# Patient Record
Sex: Female | Born: 1980 | Race: White | Hispanic: No | Marital: Married | Smoking: Never smoker
Health system: Southern US, Community
[De-identification: ages and names within clinical notes are randomized; demographics above are authoritative.]

## PROBLEM LIST (undated history)

## (undated) DIAGNOSIS — T7840XA Allergy, unspecified, initial encounter: Secondary | ICD-10-CM

## (undated) DIAGNOSIS — F419 Anxiety disorder, unspecified: Secondary | ICD-10-CM

## (undated) HISTORY — PX: MOLE REMOVAL: SHX2046

## (undated) HISTORY — DX: Anxiety disorder, unspecified: F41.9

## (undated) HISTORY — DX: Allergy, unspecified, initial encounter: T78.40XA

---

## 2005-03-16 LAB — HM MAMMOGRAPHY: HM Mammogram: NEGATIVE

## 2010-02-19 ENCOUNTER — Encounter: Payer: Self-pay | Admitting: Family Medicine

## 2010-02-19 ENCOUNTER — Ambulatory Visit: Payer: Self-pay | Admitting: Obstetrics & Gynecology

## 2010-02-19 LAB — CONVERTED CEMR LAB
Antibody Screen: NEGATIVE
Eosinophils Relative: 2 % (ref 0–5)
HCT: 38.5 % (ref 36.0–46.0)
Hepatitis B Surface Ag: NEGATIVE
Lymphocytes Relative: 26 % (ref 12–46)
Lymphs Abs: 2.4 10*3/uL (ref 0.7–4.0)
MCHC: 33.5 g/dL (ref 30.0–36.0)
MCV: 84.2 fL (ref 78.0–100.0)
Neutrophils Relative %: 65 % (ref 43–77)
Platelets: 265 10*3/uL (ref 150–400)
RDW: 13.2 % (ref 11.5–15.5)
Rh Type: POSITIVE
Rubella: 69.8 intl units/mL — ABNORMAL HIGH
WBC: 9.2 10*3/uL (ref 4.0–10.5)

## 2010-03-04 ENCOUNTER — Ambulatory Visit (HOSPITAL_COMMUNITY): Admission: RE | Admit: 2010-03-04 | Discharge: 2010-03-04 | Payer: Self-pay | Admitting: Obstetrics and Gynecology

## 2010-03-04 ENCOUNTER — Ambulatory Visit: Payer: Self-pay | Admitting: Family Medicine

## 2010-03-04 IMAGING — US US OB COMP LESS 14 WK
1 series · 14 of 28 positions shown · non-contrast
Comparison: none

OBSTETRICAL ULTRASOUND:
 This ultrasound exam was performed in the [HOSPITAL] Ultrasound Department.  The OB US report was generated in the AS system, and faxed to the ordering physician.  This report is also available in [HOSPITAL]?s Access[employer]ware and in [REDACTED] PACS.

[Series 1: us ob comp less 14 wks · 0.21mm/px · 14 of 74 slices shown]
[im 3/74]
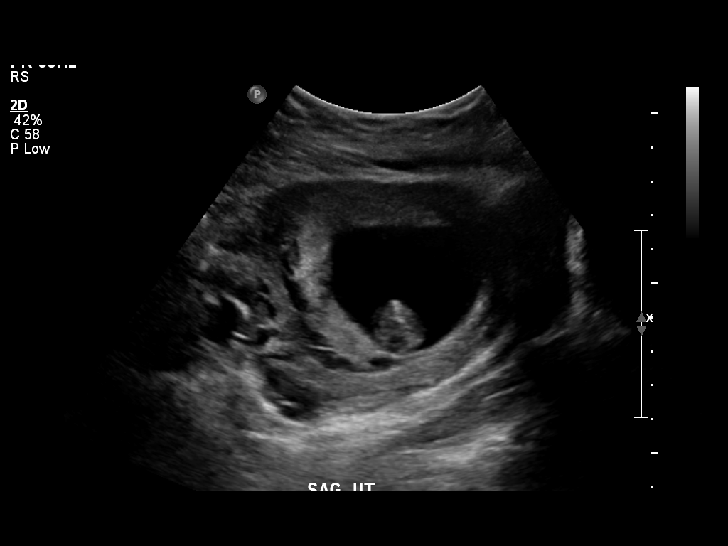
[im 9/74]
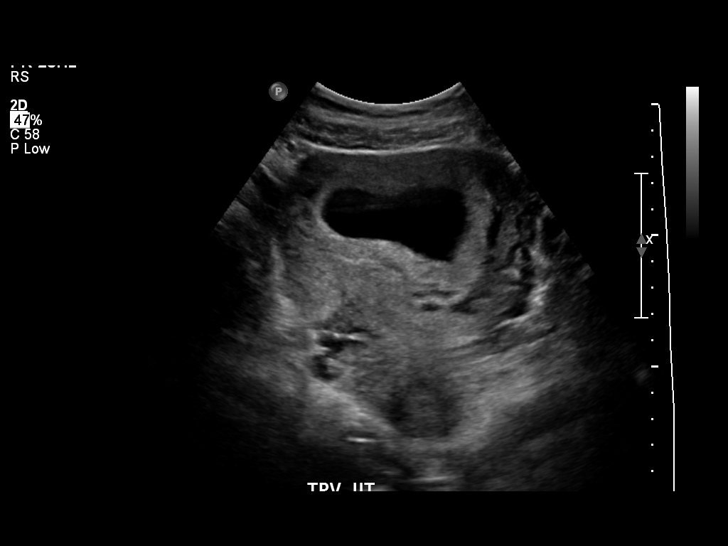
[im 14/74]
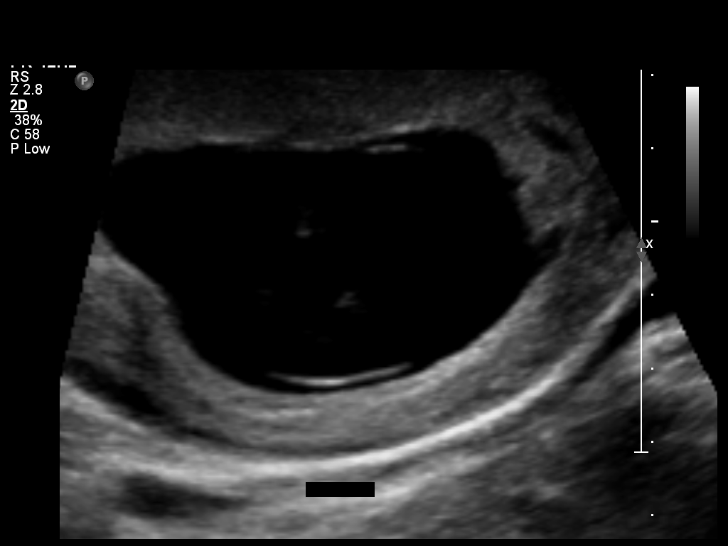
[im 19/74]
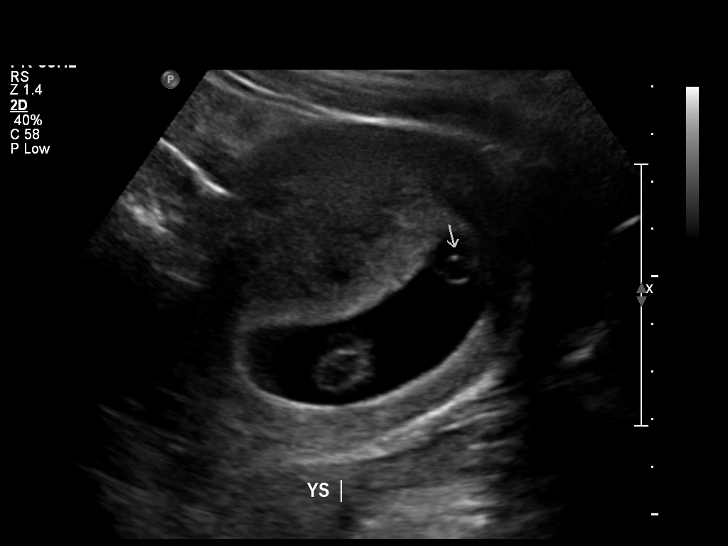
[im 25/74]
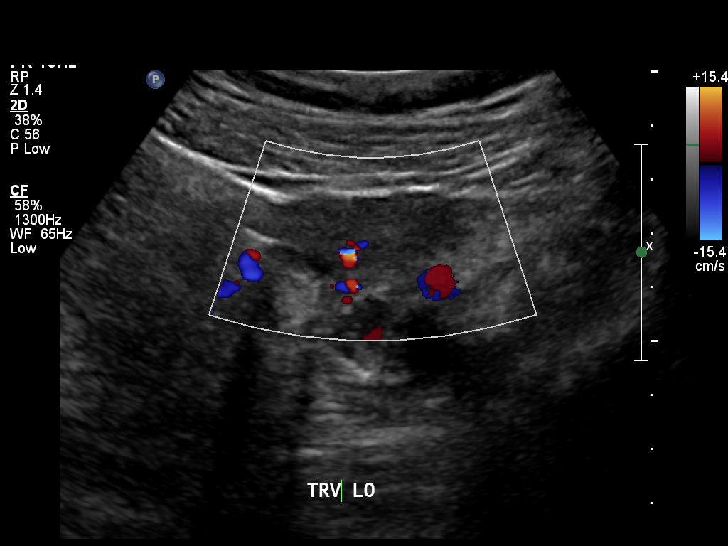
[im 30/74]
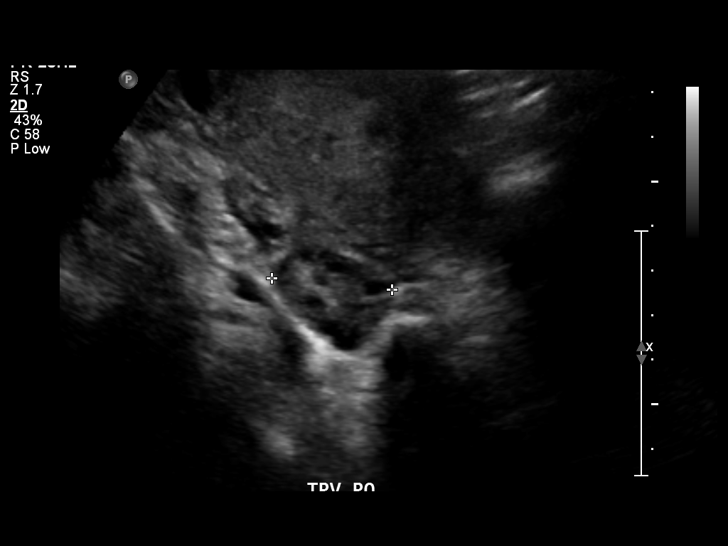
[im 36/74]
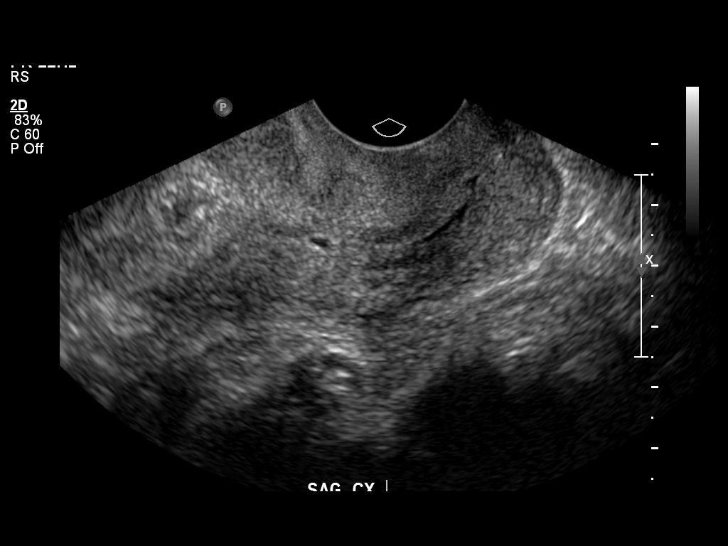
[im 41/74]
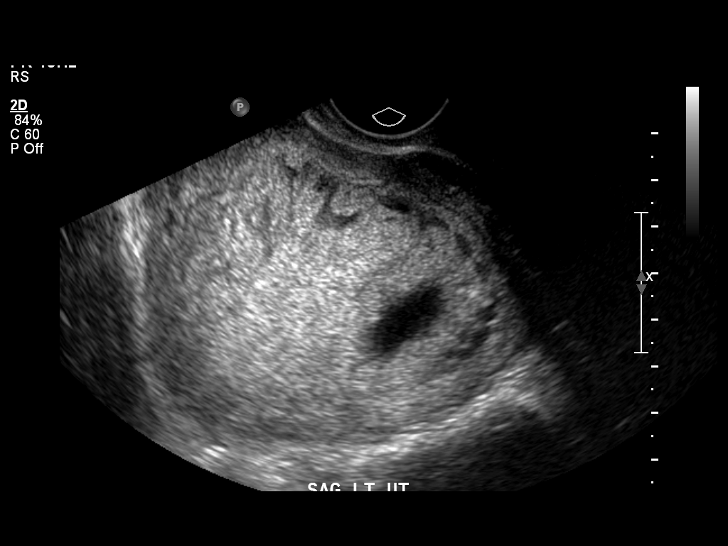
[im 46/74]
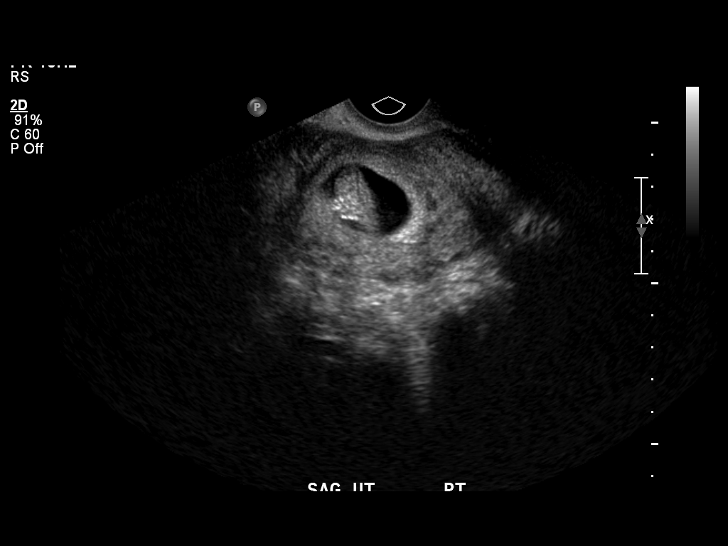
[im 52/74]
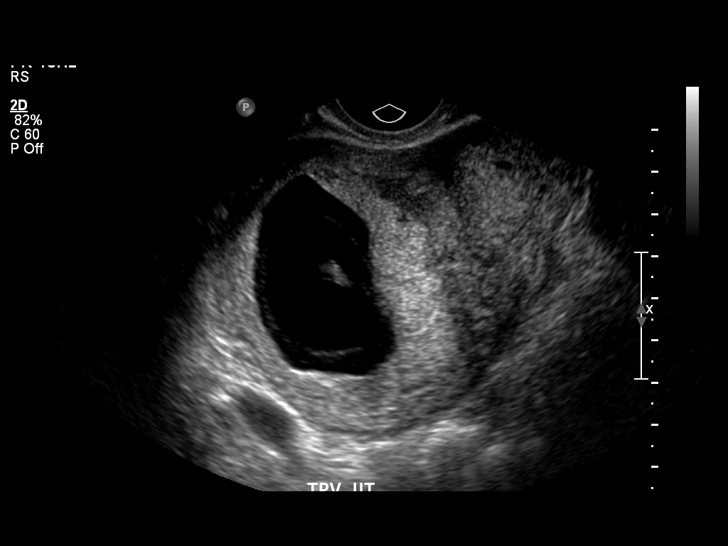
[im 57/74]
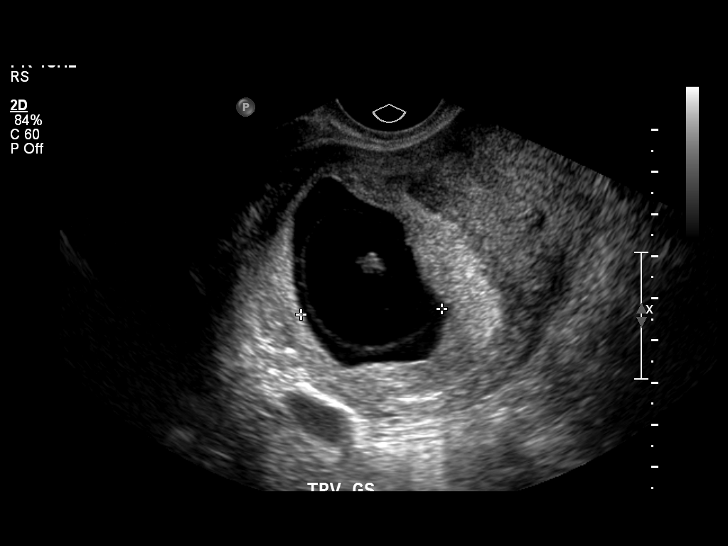
[im 63/74]
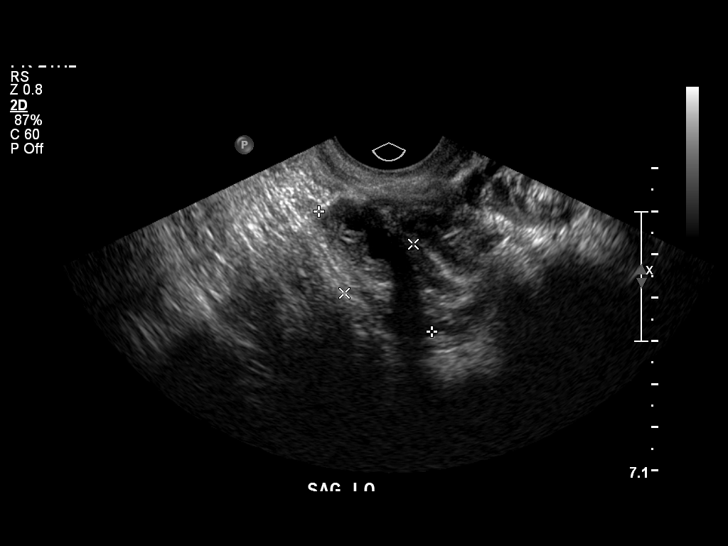
[im 68/74]
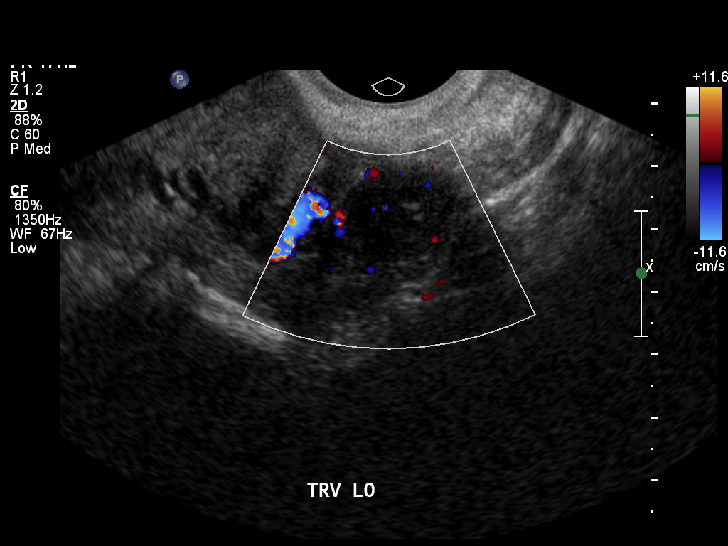
[im 74/74]
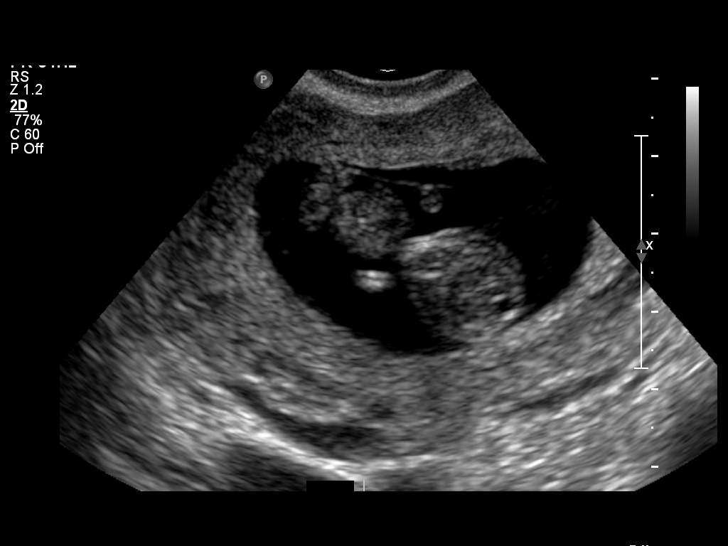

[14 of 28 positions shown; findings below may reference images not displayed]

IMPRESSION: See AS Obstetric US report.

## 2010-03-14 ENCOUNTER — Ambulatory Visit: Payer: Self-pay | Admitting: Obstetrics & Gynecology

## 2010-03-15 ENCOUNTER — Inpatient Hospital Stay (HOSPITAL_COMMUNITY): Admission: AD | Admit: 2010-03-15 | Discharge: 2010-03-15 | Payer: Self-pay | Admitting: Obstetrics & Gynecology

## 2010-04-01 ENCOUNTER — Ambulatory Visit: Payer: Self-pay | Admitting: Obstetrics & Gynecology

## 2010-09-19 ENCOUNTER — Observation Stay: Payer: Self-pay

## 2010-09-21 ENCOUNTER — Inpatient Hospital Stay: Payer: Self-pay

## 2010-10-14 ENCOUNTER — Ambulatory Visit: Payer: Self-pay | Admitting: Internal Medicine

## 2010-10-23 ENCOUNTER — Ambulatory Visit: Payer: Self-pay | Admitting: Internal Medicine

## 2010-11-13 ENCOUNTER — Ambulatory Visit: Payer: Self-pay | Admitting: Internal Medicine

## 2011-01-04 ENCOUNTER — Encounter: Payer: Self-pay | Admitting: Obstetrics & Gynecology

## 2011-08-28 ENCOUNTER — Other Ambulatory Visit: Payer: Self-pay | Admitting: Dermatology

## 2013-03-21 ENCOUNTER — Ambulatory Visit: Payer: Self-pay | Admitting: Internal Medicine

## 2013-04-17 LAB — HM PAP SMEAR: HM PAP: NORMAL

## 2014-08-04 ENCOUNTER — Inpatient Hospital Stay: Payer: Self-pay | Admitting: Obstetrics & Gynecology

## 2014-08-04 LAB — CBC WITH DIFFERENTIAL/PLATELET
BASOS PCT: 0.4 %
Basophil #: 0 10*3/uL (ref 0.0–0.1)
EOS ABS: 0.1 10*3/uL (ref 0.0–0.7)
Eosinophil %: 0.8 %
HCT: 33.8 % — ABNORMAL LOW (ref 35.0–47.0)
HGB: 11 g/dL — AB (ref 12.0–16.0)
LYMPHS ABS: 1.1 10*3/uL (ref 1.0–3.6)
LYMPHS PCT: 13.5 %
MCH: 28.1 pg (ref 26.0–34.0)
MCHC: 32.4 g/dL (ref 32.0–36.0)
MCV: 87 fL (ref 80–100)
MONOS PCT: 5.5 %
Monocyte #: 0.5 x10 3/mm (ref 0.2–0.9)
NEUTROS ABS: 6.7 10*3/uL — AB (ref 1.4–6.5)
Neutrophil %: 79.8 %
PLATELETS: 200 10*3/uL (ref 150–440)
RBC: 3.9 10*6/uL (ref 3.80–5.20)
RDW: 13.9 % (ref 11.5–14.5)
WBC: 8.4 10*3/uL (ref 3.6–11.0)

## 2015-08-28 ENCOUNTER — Ambulatory Visit (INDEPENDENT_AMBULATORY_CARE_PROVIDER_SITE_OTHER): Payer: BLUE CROSS/BLUE SHIELD | Admitting: Nurse Practitioner

## 2015-08-28 ENCOUNTER — Encounter: Payer: Self-pay | Admitting: Nurse Practitioner

## 2015-08-28 ENCOUNTER — Encounter (INDEPENDENT_AMBULATORY_CARE_PROVIDER_SITE_OTHER): Payer: Self-pay

## 2015-08-28 VITALS — BP 104/84 | HR 91 | Temp 98.2°F | Resp 14 | Ht 65.0 in | Wt 192.6 lb

## 2015-08-28 DIAGNOSIS — Z23 Encounter for immunization: Secondary | ICD-10-CM | POA: Diagnosis not present

## 2015-08-28 DIAGNOSIS — Z7189 Other specified counseling: Secondary | ICD-10-CM | POA: Diagnosis not present

## 2015-08-28 DIAGNOSIS — Z7689 Persons encountering health services in other specified circumstances: Secondary | ICD-10-CM | POA: Insufficient documentation

## 2015-08-28 DIAGNOSIS — Z889 Allergy status to unspecified drugs, medicaments and biological substances status: Secondary | ICD-10-CM | POA: Insufficient documentation

## 2015-08-28 DIAGNOSIS — Z9109 Other allergy status, other than to drugs and biological substances: Secondary | ICD-10-CM | POA: Diagnosis not present

## 2015-08-28 DIAGNOSIS — D229 Melanocytic nevi, unspecified: Secondary | ICD-10-CM | POA: Insufficient documentation

## 2015-08-28 DIAGNOSIS — D239 Other benign neoplasm of skin, unspecified: Secondary | ICD-10-CM

## 2015-08-28 DIAGNOSIS — Z01419 Encounter for gynecological examination (general) (routine) without abnormal findings: Secondary | ICD-10-CM | POA: Insufficient documentation

## 2015-08-28 DIAGNOSIS — F411 Generalized anxiety disorder: Secondary | ICD-10-CM | POA: Diagnosis not present

## 2015-08-28 DIAGNOSIS — Z0000 Encounter for general adult medical examination without abnormal findings: Secondary | ICD-10-CM

## 2015-08-28 LAB — LIPID PANEL
CHOLESTEROL: 215 mg/dL — AB (ref 0–200)
HDL: 74.3 mg/dL (ref >39.00)
LDL Cholesterol: 116 mg/dL — ABNORMAL HIGH (ref 0–99)
NONHDL: 140.64
Total CHOL/HDL Ratio: 3
Triglycerides: 123 mg/dL (ref 0.0–149.0)
VLDL: 24.6 mg/dL (ref 0.0–40.0)

## 2015-08-28 LAB — CBC WITH DIFFERENTIAL/PLATELET
BASOS PCT: 0.6 % (ref 0.0–3.0)
Basophils Absolute: 0 10*3/uL (ref 0.0–0.1)
EOS PCT: 1.1 % (ref 0.0–5.0)
Eosinophils Absolute: 0.1 10*3/uL (ref 0.0–0.7)
HCT: 40.6 % (ref 36.0–46.0)
HEMOGLOBIN: 13.7 g/dL (ref 12.0–15.0)
Lymphocytes Relative: 25 % (ref 12.0–46.0)
Lymphs Abs: 1.4 10*3/uL (ref 0.7–4.0)
MCHC: 33.7 g/dL (ref 30.0–36.0)
MCV: 86.3 fl (ref 78.0–100.0)
MONOS PCT: 6.1 % (ref 3.0–12.0)
Monocytes Absolute: 0.3 10*3/uL (ref 0.1–1.0)
Neutro Abs: 3.7 10*3/uL (ref 1.4–7.7)
Neutrophils Relative %: 67.2 % (ref 43.0–77.0)
Platelets: 259 10*3/uL (ref 150.0–400.0)
RBC: 4.71 Mil/uL (ref 3.87–5.11)
RDW: 13.1 % (ref 11.5–15.5)
WBC: 5.5 10*3/uL (ref 4.0–10.5)

## 2015-08-28 LAB — COMPREHENSIVE METABOLIC PANEL
ALBUMIN: 4.4 g/dL (ref 3.5–5.2)
ALK PHOS: 80 U/L (ref 39–117)
ALT: 15 U/L (ref 0–35)
AST: 14 U/L (ref 0–37)
BILIRUBIN TOTAL: 0.8 mg/dL (ref 0.2–1.2)
BUN: 16 mg/dL (ref 6–23)
CO2: 23 mEq/L (ref 19–32)
Calcium: 8.8 mg/dL (ref 8.4–10.5)
Chloride: 104 mEq/L (ref 96–112)
Creatinine, Ser: 0.73 mg/dL (ref 0.40–1.20)
GFR: 95.91 mL/min (ref >60.00)
GLUCOSE: 102 mg/dL — AB (ref 70–99)
POTASSIUM: 3.5 meq/L (ref 3.5–5.1)
Sodium: 136 mEq/L (ref 135–145)
TOTAL PROTEIN: 7.6 g/dL (ref 6.0–8.3)

## 2015-08-28 NOTE — Progress Notes (Signed)
Pre visit review using our clinic review tool, if applicable. No additional management support is needed unless otherwise documented below in the visit note. 

## 2015-08-28 NOTE — Assessment & Plan Note (Signed)
Physical today due to no complaints. Will obtain records from previous provider at [facility].

## 2015-08-28 NOTE — Assessment & Plan Note (Signed)
Stable on Lexapro 10 mg daily. No need for refills. Will follow

## 2015-08-28 NOTE — Patient Instructions (Signed)

## 2015-08-28 NOTE — Assessment & Plan Note (Signed)
Stable. Pt sees Dermatology in [place] yearly for full body check. She has had multiple sites biopsied.

## 2015-08-28 NOTE — Progress Notes (Signed)
Patient ID: Sophia Garrett, female    DOB: 02/02/1981  Age: 34 y.o. MRN: [id number]  CC: Establish Care   HPI Sophia Garrett presents for establsihing care and annual exam.   1) Health Maintenance-   Diet- No formal   Exercise- Tennis 3-4 days a week 1 hour   Immunizations- unknown tdap; Flu  Mammogram- 2006, normal   Pap- 2014  Eye Exam- 2014, no vision changes   LMP- 4 weeks ago normal for pt, still nursing   2) Chronic Problems-  Depression/Anxiety- Lexapro 10 mg, switched to generic 2 weeks ago and notices a small difference   Allergies- Dr. Remus Blake, allergy testing    Zyrtec daily   Has dermatologist in [place], multiple nevi removed.  History Sophia Garrett has a past medical history of Anxiety and Allergy.   She has no past surgical history on file.   Her family history includes Alcohol abuse in her father; Cancer in her maternal aunt, maternal grandfather, maternal grandmother, paternal aunt, paternal aunt, paternal grandfather, and paternal grandmother; Heart disease in her father; Hyperlipidemia in her mother; Hypertension in her father.She reports that she has never smoked. She has never used smokeless tobacco. She reports that she drinks alcohol. She reports that she does not use illicit drugs.  No outpatient prescriptions prior to visit.   No facility-administered medications prior to visit.    ROS Review of Systems  Constitutional: Negative for fever, chills, diaphoresis and fatigue.  Respiratory: Negative for chest tightness, shortness of breath and wheezing.   Cardiovascular: Negative for chest pain, palpitations and leg swelling.  Gastrointestinal: Negative for nausea, vomiting and diarrhea.  Skin: Negative for rash.  Neurological: Negative for dizziness, weakness, numbness and headaches.  Psychiatric/Behavioral: The patient is not nervous/anxious.     Objective:  BP 104/84 mmHg  Pulse 91  Temp(Src) 98.2 F (36.8 C)  Resp 14  Ht 5\' 5"  (1.651 m)  Wt 192  lb 9.6 oz (87.363 kg)  BMI 32.05 kg/m2  SpO2 96%  Physical Exam  Constitutional: She is oriented to person, place, and time. She appears well-developed and well-nourished. No distress.  HENT:  Head: Normocephalic and atraumatic.  Right Ear: External ear normal.  Left Ear: External ear normal.  Mouth/Throat: No oropharyngeal exudate.  TM's clear bilaterally  Eyes: EOM are normal. Pupils are equal, round, and reactive to light. Right eye exhibits no discharge. Left eye exhibits no discharge. No scleral icterus.  Neck: Normal range of motion. Neck supple. No thyromegaly present.  Cardiovascular: Normal rate, regular rhythm and normal heart sounds.  Exam reveals no gallop and no friction rub.   No murmur heard. Pulmonary/Chest: Effort normal and breath sounds normal. No respiratory distress. She has no wheezes. She has no rales. She exhibits no tenderness.  Abdominal: Soft. Bowel sounds are normal. She exhibits no distension and no mass. There is no tenderness. There is no rebound and no guarding.  Obese  Genitourinary:  Deferred due to no symptoms and due next yr  Musculoskeletal: Normal range of motion. She exhibits no edema or tenderness.  Crepitus of knees bilaterally  Lymphadenopathy:    She has no cervical adenopathy.  Neurological: She is alert and oriented to person, place, and time. No cranial nerve deficit. She exhibits normal muscle tone. Coordination normal.  Skin: Skin is warm and dry. No rash noted. She is not diaphoretic.  Psychiatric: She has a normal mood and affect. Her behavior is normal. Judgment and thought content normal.   Assessment &  Plan:   Sophia Garrett was seen today for establish care.  Diagnoses and all orders for this visit:  Routine general medical examination at a health care facility -     Comprehensive metabolic panel -     Lipid panel -     CBC with Differential/Platelet  Encounter to establish care  Generalized anxiety disorder  Multiple  allergies  Multiple benign nevi   I am having Sophia Garrett maintain her escitalopram.  Meds ordered this encounter  Medications  . escitalopram (LEXAPRO) 10 MG tablet    Sig: Take 10 mg by mouth daily.     Follow-up: Return in about 1 year (around 08/27/2016) for CPE.

## 2015-08-28 NOTE — Assessment & Plan Note (Signed)
Discussed acute and chronic issues. Reviewed health maintenance measures, PFSHx, and immunizations. Obtain routine labs Lipid panel, CBC w/ diff, and CMET.   Flu vaccination given today  PAP due 2017 and Clinical breast exam

## 2015-08-28 NOTE — Assessment & Plan Note (Signed)
Pt saw allergist in past with testing. She reports multiple environmental allergies and consistent use of Zyrtec. Will follow

## 2015-09-26 ENCOUNTER — Encounter: Payer: Self-pay | Admitting: Nurse Practitioner

## 2015-09-26 ENCOUNTER — Ambulatory Visit (INDEPENDENT_AMBULATORY_CARE_PROVIDER_SITE_OTHER): Payer: BLUE CROSS/BLUE SHIELD | Admitting: Nurse Practitioner

## 2015-09-26 VITALS — BP 112/70 | HR 89 | Temp 98.5°F | Resp 14 | Ht 65.0 in | Wt 194.8 lb

## 2015-09-26 DIAGNOSIS — M791 Myalgia: Secondary | ICD-10-CM | POA: Diagnosis not present

## 2015-09-26 DIAGNOSIS — M609 Myositis, unspecified: Secondary | ICD-10-CM | POA: Diagnosis not present

## 2015-09-26 DIAGNOSIS — IMO0001 Reserved for inherently not codable concepts without codable children: Secondary | ICD-10-CM | POA: Insufficient documentation

## 2015-09-26 LAB — CBC WITH DIFFERENTIAL/PLATELET
BASOS ABS: 0 10*3/uL (ref 0.0–0.1)
Basophils Relative: 0.5 % (ref 0.0–3.0)
EOS PCT: 2.3 % (ref 0.0–5.0)
Eosinophils Absolute: 0.2 10*3/uL (ref 0.0–0.7)
HEMATOCRIT: 37.6 % (ref 36.0–46.0)
HEMOGLOBIN: 12.7 g/dL (ref 12.0–15.0)
LYMPHS ABS: 1.5 10*3/uL (ref 0.7–4.0)
LYMPHS PCT: 19.5 % (ref 12.0–46.0)
MCHC: 33.7 g/dL (ref 30.0–36.0)
MCV: 85.7 fl (ref 78.0–100.0)
MONOS PCT: 6.1 % (ref 3.0–12.0)
Monocytes Absolute: 0.5 10*3/uL (ref 0.1–1.0)
NEUTROS PCT: 71.6 % (ref 43.0–77.0)
Neutro Abs: 5.5 10*3/uL (ref 1.4–7.7)
Platelets: 283 10*3/uL (ref 150.0–400.0)
RBC: 4.39 Mil/uL (ref 3.87–5.11)
RDW: 12.8 % (ref 11.5–15.5)
WBC: 7.7 10*3/uL (ref 4.0–10.5)

## 2015-09-26 LAB — COMPREHENSIVE METABOLIC PANEL
ALBUMIN: 4.1 g/dL (ref 3.5–5.2)
ALK PHOS: 81 U/L (ref 39–117)
ALT: 20 U/L (ref 0–35)
AST: 22 U/L (ref 0–37)
BUN: 13 mg/dL (ref 6–23)
CHLORIDE: 103 meq/L (ref 96–112)
CO2: 26 mEq/L (ref 19–32)
Calcium: 9.2 mg/dL (ref 8.4–10.5)
Creatinine, Ser: 0.76 mg/dL (ref 0.40–1.20)
GFR: 91.51 mL/min (ref >60.00)
Glucose, Bld: 93 mg/dL (ref 70–99)
POTASSIUM: 3.7 meq/L (ref 3.5–5.1)
SODIUM: 137 meq/L (ref 135–145)
Total Bilirubin: 0.4 mg/dL (ref 0.2–1.2)
Total Protein: 7.3 g/dL (ref 6.0–8.3)

## 2015-09-26 LAB — CK: CK TOTAL: 109 U/L (ref 7–177)

## 2015-09-26 NOTE — Patient Instructions (Signed)
LOTS OF WATER! Take it easy.

## 2015-09-26 NOTE — Assessment & Plan Note (Signed)
Patient's husband was diagnosed with viral cause for rhabdomyolysis. He would like to have testing done today. She had similar symptoms but on a milder scale. Asked her to continue drinking lots of water and to follow-up if anything changes.

## 2015-09-26 NOTE — Progress Notes (Signed)
Patient ID: Sophia Garrett, female    DOB: March 01, 1981  Age: 34 y.o. MRN: [id number]  CC: Muscle Pain   HPI Sophia Garrett presents for myalgias x 1 week.   1) Last Thursday 99.5  Husband had sore muscles, 101 fever, and was much more sore.  Pt reports her back was sore and hands were sore  Urine not dark she reports  Husband diagnosed with viral cause of rhabdomyolysis   History Sophia Garrett has a past medical history of Anxiety and Allergy.   She has no past surgical history on file.   Her family history includes Alcohol abuse in her father; Cancer in her maternal aunt, maternal grandfather, maternal grandmother, paternal aunt, paternal aunt, paternal grandfather, and paternal grandmother; Heart disease in her father; Hyperlipidemia in her mother; Hypertension in her father.She reports that she has never smoked. She has never used smokeless tobacco. She reports that she drinks alcohol. She reports that she does not use illicit drugs.  Outpatient Prescriptions Prior to Visit  Medication Sig Dispense Refill  . escitalopram (LEXAPRO) 10 MG tablet Take 10 mg by mouth daily.     No facility-administered medications prior to visit.    ROS Review of Systems  Constitutional: Negative for fever, chills, diaphoresis and fatigue.  Respiratory: Negative for chest tightness, shortness of breath and wheezing.   Cardiovascular: Negative for chest pain, palpitations and leg swelling.  Gastrointestinal: Negative for nausea, vomiting and diarrhea.  Genitourinary: Negative for dysuria.  Musculoskeletal: Positive for myalgias.  Skin: Negative for rash.  Neurological: Negative for dizziness, weakness, numbness and headaches.    Objective:  BP 112/70 mmHg  Pulse 89  Temp(Src) 98.5 F (36.9 C)  Resp 14  Ht 5\' 5"  (1.651 m)  Wt 194 lb 12.8 oz (88.361 kg)  BMI 32.42 kg/m2  SpO2 96%  Physical Exam  Constitutional: She is oriented to person, place, and time. She appears well-developed and  well-nourished. No distress.  HENT:  Head: Normocephalic and atraumatic.  Right Ear: External ear normal.  Left Ear: External ear normal.  Cardiovascular: Normal rate, regular rhythm and normal heart sounds.  Exam reveals no gallop and no friction rub.   No murmur heard. Pulmonary/Chest: Effort normal and breath sounds normal. No respiratory distress. She has no wheezes. She has no rales. She exhibits no tenderness.  Musculoskeletal: She exhibits tenderness. She exhibits no edema.  Generalized tenderness  Neurological: She is alert and oriented to person, place, and time. No cranial nerve deficit. She exhibits normal muscle tone. Coordination normal.  Skin: Skin is warm and dry. No rash noted. She is not diaphoretic.  Psychiatric: She has a normal mood and affect. Her behavior is normal. Judgment and thought content normal.   Assessment & Plan:   Sophia Garrett was seen today for muscle pain.  Diagnoses and all orders for this visit:  Myalgia and myositis -     Comprehensive metabolic panel -     CK (Creatine Kinase) -     CBC w/Diff   I am having Sophia Garrett maintain her escitalopram.  No orders of the defined types were placed in this encounter.     Follow-up: Return if symptoms worsen or fail to improve.

## 2015-09-26 NOTE — Progress Notes (Signed)
Pre visit review using our clinic review tool, if applicable. No additional management support is needed unless otherwise documented below in the visit note. 

## 2015-11-15 ENCOUNTER — Other Ambulatory Visit: Payer: Self-pay | Admitting: Internal Medicine

## 2015-11-15 MED ORDER — ESCITALOPRAM OXALATE 10 MG PO TABS: 10.0000 mg | ORAL_TABLET | Freq: Every day | ORAL | Status: DC

## 2016-07-06 ENCOUNTER — Other Ambulatory Visit: Payer: Self-pay | Admitting: Internal Medicine

## 2016-07-06 MED ORDER — ESCITALOPRAM OXALATE 10 MG PO TABS: 10.0000 mg | ORAL_TABLET | Freq: Every day | ORAL | 3 refills | Status: DC

## 2017-07-06 ENCOUNTER — Encounter: Payer: Self-pay | Admitting: Family

## 2017-07-06 ENCOUNTER — Encounter (INDEPENDENT_AMBULATORY_CARE_PROVIDER_SITE_OTHER): Payer: Self-pay

## 2017-07-06 ENCOUNTER — Ambulatory Visit (INDEPENDENT_AMBULATORY_CARE_PROVIDER_SITE_OTHER): Payer: No Typology Code available for payment source | Admitting: Family

## 2017-07-06 VITALS — BP 118/68 | HR 100 | Temp 98.4°F | Ht 65.0 in | Wt 196.2 lb

## 2017-07-06 DIAGNOSIS — Z111 Encounter for screening for respiratory tuberculosis: Secondary | ICD-10-CM

## 2017-07-06 DIAGNOSIS — Z7689 Persons encountering health services in other specified circumstances: Secondary | ICD-10-CM

## 2017-07-06 DIAGNOSIS — F411 Generalized anxiety disorder: Secondary | ICD-10-CM | POA: Diagnosis not present

## 2017-07-06 NOTE — Patient Instructions (Addendum)
Fasting labs when able to - make an appointment   Return for PPD read  May increase lexapro to 20mg  total per day if needed  Follow up 3-6 months

## 2017-07-06 NOTE — Progress Notes (Signed)
Subjective:    Patient ID: Pervis Hocking, female    DOB: 02-13-1981, 36 y.o.   MRN: [id number]  CC: Byron B Kantner is a 36 y.o. female who presents today for follow up.   HPI: Feeling well. No complaints today.   Today for health examination typically. She is a Corporate investment banker.  Depression and anxiety- has been on lexapro for several years, consistent for the past 3 years. Notes that has had anxiety attacks of late. Trouble sleeping as worrying about going back to work.  No thoughts of hurting herself of anyone else.  Denies exertional chest pain or pressure, numbness or tingling radiating to left arm or jaw, palpitations, dizziness, frequent headaches, changes in vision, or shortness of breath.   Follows with [facility], OB. Plans to discuss family h/o cancer and plans to pursue genetic testing.  Follows with dermatology.        HISTORY:  Past Medical History:  Diagnosis Date  . Allergy   . Anxiety    No past surgical history on file. Family History  Problem Relation Age of Onset  . Hyperlipidemia Mother   . Skin cancer Mother        'not melanoma'  . Alcohol abuse Father   . Heart disease Father   . Hypertension Father   . Cancer Maternal Aunt        Breast Cancer  . Cancer Paternal Aunt        Breast Cancer  . Cancer Maternal Grandmother        Breast Cancer  . Cancer Maternal Grandfather        Prostate/Lung Cancer  . Cancer Paternal Grandmother        Breast Cancer  . Cancer Paternal Grandfather        Prostate Cancer  . Cancer Paternal Aunt        Breast Cancer  . Colon cancer Neg Hx     Allergies: Patient has no known allergies. Current Outpatient Prescriptions on File Prior to Visit  Medication Sig Dispense Refill  . escitalopram (LEXAPRO) 10 MG tablet Take 1 tablet (10 mg total) by mouth daily. 90 tablet 3   No current facility-administered medications on file prior to visit.     Social History  Substance Use Topics  . Smoking  status: Never Smoker  . Smokeless tobacco: Never Used  . Alcohol use 0.0 oz/week     Comment: Occasionaly     Review of Systems  Constitutional: Negative for chills and fever.  HENT: Negative for hearing loss.   Eyes: Negative for visual disturbance.  Respiratory: Negative for cough and shortness of breath.   Cardiovascular: Negative for chest pain and palpitations.  Gastrointestinal: Negative for nausea and vomiting.  Neurological: Negative for headaches.  Psychiatric/Behavioral: Positive for sleep disturbance. The patient is nervous/anxious.       Objective:    BP 118/68   Pulse 100   Temp 98.4 F (36.9 C) (Oral)   Ht 5\' 5"  (1.651 m)   Wt 196 lb 3.2 oz (89 kg)   SpO2 97%   BMI 32.65 kg/m  BP Readings from Last 3 Encounters:  07/06/17 118/68  09/26/15 112/70  08/28/15 104/84   Wt Readings from Last 3 Encounters:  07/06/17 196 lb 3.2 oz (89 kg)  09/26/15 194 lb 12.8 oz (88.4 kg)  08/28/15 192 lb 9.6 oz (87.4 kg)    Physical Exam  Constitutional: She appears well-developed and well-nourished.  Eyes: Conjunctivae are normal.  Cardiovascular: Normal rate, regular rhythm, normal heart sounds and normal pulses.   Pulmonary/Chest: Effort normal and breath sounds normal. She has no wheezes. She has no rhonchi. She has no rales.  Neurological: She is alert.  Skin: Skin is warm and dry.  Psychiatric: She has a normal mood and affect. Her speech is normal and behavior is normal. Thought content normal.  Vitals reviewed.      Assessment & Plan:   Problem List Items Addressed This Visit      Other   Encounter to establish care    Advised to discuss family breast cancer history with OB, including genetic screening and earlier  mammogram. Declines CBE and pelvic as follows with OB. PPD placed. Will return for recheck. Form for health examination will be completed after reading. Immunization UTD. Screening labs ordered.       Relevant Orders   CBC with  Differential/Platelet   Comprehensive metabolic panel   Hemoglobin A1c   Lipid panel   TSH   VITAMIN D 25 Hydroxy (Vit-D Deficiency, Fractures)   Generalized anxiety disorder - Primary    Increased as heads back to work. Advised to increased lexapro to 20mg . Patient will let me know if she decides to do this and if helps with her breakthrough anxiety. We also discussed buspar, wellbutrin as adjuncts in the future.        Other Visit Diagnoses    Encounter for PPD test       Relevant Orders   PPD (Completed)       I am having Ms. Bollen maintain her escitalopram.   No orders of the defined types were placed in this encounter.   Return precautions given.   Risks, benefits, and alternatives of the medications and treatment plan prescribed today were discussed, and patient expressed understanding.   Education regarding symptom management and diagnosis given to patient on AVS.  Continue to follow with Burnard Hawthorne, FNP for routine health maintenance.   Tarnisha B Saperstein and I agreed with plan.   Mable Paris, FNP  I spent 25 min face to face w/ pt. Of which greater than 50% of time was spent Discussing GAD and we discussed various treatment regimens

## 2017-07-06 NOTE — Assessment & Plan Note (Addendum)
Advised to discuss family breast cancer history with OB, including genetic screening and earlier  mammogram. Declines CBE and pelvic as follows with OB. PPD placed. Will return for recheck. Form for health examination will be completed after reading. Immunization UTD. Screening labs ordered.

## 2017-07-06 NOTE — Assessment & Plan Note (Signed)
Increased as heads back to work. Advised to increased lexapro to 20mg . Patient will let me know if she decides to do this and if helps with her breakthrough anxiety. We also discussed buspar, wellbutrin as adjuncts in the future.

## 2017-07-06 NOTE — Progress Notes (Signed)
Pre visit review using our clinic review tool, if applicable. No additional management support is needed unless otherwise documented below in the visit note. 

## 2017-07-08 ENCOUNTER — Ambulatory Visit (INDEPENDENT_AMBULATORY_CARE_PROVIDER_SITE_OTHER): Payer: No Typology Code available for payment source

## 2017-07-08 ENCOUNTER — Ambulatory Visit: Payer: No Typology Code available for payment source

## 2017-07-08 DIAGNOSIS — Z111 Encounter for screening for respiratory tuberculosis: Secondary | ICD-10-CM

## 2017-07-08 LAB — TB SKIN TEST
INDURATION: 0 mm
TB SKIN TEST: NEGATIVE

## 2017-07-08 NOTE — Progress Notes (Signed)
Patient comes in for PPD reading . See results for documentation.   

## 2017-07-22 ENCOUNTER — Telehealth: Payer: Self-pay | Admitting: Family

## 2017-07-22 MED ORDER — ESCITALOPRAM OXALATE 10 MG PO TABS: 10.0000 mg | ORAL_TABLET | Freq: Every day | ORAL | 3 refills | Status: DC

## 2017-07-22 NOTE — Telephone Encounter (Signed)
Pt called requesting a refill on her escitalopram (LEXAPRO) 10 MG tablet. Please advise, thank you!  [place], [place]  Call pt @ [phone]

## 2017-07-22 NOTE — Telephone Encounter (Signed)
Medcation has been refilled.

## 2017-07-30 ENCOUNTER — Other Ambulatory Visit: Payer: No Typology Code available for payment source

## 2017-10-01 ENCOUNTER — Other Ambulatory Visit: Payer: Self-pay | Admitting: Family

## 2017-10-01 NOTE — Telephone Encounter (Signed)
Pt called and is requesting a refill on her escitalopram (LEXAPRO) 10 MG tablet. Pt states that she is take 1.5 tablets. Pt would like the 10 mg called in because if she doesn't have a very anxious week she does't have to ut them. Please advise, thank you!  [place], [place]  Call pt @ [phone]

## 2017-10-01 NOTE — Telephone Encounter (Signed)
Ok to add the .5 to the order? Correct Medication script has been pended as per request. Please advise.

## 2017-10-04 ENCOUNTER — Telehealth: Payer: Self-pay | Admitting: Family

## 2017-10-04 MED ORDER — ESCITALOPRAM OXALATE 10 MG PO TABS: 15.0000 mg | ORAL_TABLET | Freq: Every day | ORAL | 1 refills | Status: DC

## 2017-10-06 NOTE — Telephone Encounter (Signed)
close

## 2018-04-02 ENCOUNTER — Other Ambulatory Visit: Payer: Self-pay | Admitting: Family

## 2018-07-14 ENCOUNTER — Ambulatory Visit: Payer: BC Managed Care – PPO | Admitting: Adult Health

## 2018-07-14 ENCOUNTER — Encounter: Payer: Self-pay | Admitting: Adult Health

## 2018-07-14 VITALS — BP 122/82 | HR 86 | Resp 16 | Ht 65.0 in | Wt 206.4 lb

## 2018-07-14 DIAGNOSIS — J302 Other seasonal allergic rhinitis: Secondary | ICD-10-CM

## 2018-07-14 DIAGNOSIS — F411 Generalized anxiety disorder: Secondary | ICD-10-CM | POA: Diagnosis not present

## 2018-07-14 NOTE — Patient Instructions (Signed)

## 2018-07-14 NOTE — Progress Notes (Signed)
[facility] [place], [place] [address]  Internal MEDICINE  Office Visit Note  Patient Name: Sophia Garrett  [id number]  [id number]  Date of Service: 07/14/2018   Complaints/HPI Pt is here for establishment of PCP. Chief Complaint  Patient presents with  . Anxiety  . Depression  . New Patient (Initial Visit)    establish new pcp   HPI: Pt here to establish PCP. She has a history remarkable for anxiety.  She is currently taking Zoloft.  She denies current need. She is interested in weight loss clinic. She denies current complaints.  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     Current Medication: Outpatient Encounter Medications as of 07/14/2018  Medication Sig  . escitalopram (LEXAPRO) 10 MG tablet TAKE 1 AND 1/2 TABLETS DAILY BY MOUTH   No facility-administered encounter medications on file as of 07/14/2018.     Surgical History: History reviewed. No pertinent surgical history.  Medical History: Past Medical History:  Diagnosis Date  . Allergy   . Anxiety     Family History: Family History  Problem Relation Age of Onset  . Hyperlipidemia Mother   . Skin cancer Mother        'not melanoma'  . Alcohol abuse Father   . Heart disease Father   . Hypertension Father   . Cancer Maternal Aunt        Breast Cancer  . Cancer Paternal Aunt        Breast Cancer  . Cancer Maternal Grandmother        Breast Cancer  . Cancer Maternal Grandfather        Prostate/Lung Cancer  . Cancer Paternal Grandmother        Breast Cancer  . Cancer Paternal Grandfather        Prostate Cancer  . Cancer Paternal Aunt        Breast Cancer  . Colon cancer Neg Hx     Social History   Socioeconomic History  . Marital status: Married    Spouse name: Not on file  . Number of children: Not on file  . Years of education: Not on file  . Highest education level: Not on file  Occupational History  . Not on file  Social Needs  .  Financial resource strain: Not on file  . Food insecurity:    Worry: Not on file    Inability: Not on file  . Transportation needs:    Medical: Not on file    Non-medical: Not on file  Tobacco Use  . Smoking status: Never Smoker  . Smokeless tobacco: Never Used  Substance and Sexual Activity  . Alcohol use: Yes    Alcohol/week: 0.0 oz    Comment: Occasionaly   . Drug use: No  . Sexual activity: Yes    Partners: Male    Birth control/protection: None    Comment: Husband  Lifestyle  . Physical activity:    Days per week: Not on file    Minutes per session: Not on file  . Stress: Not on file  Relationships  . Social connections:    Talks on phone: Not on file    Gets together: Not on file    Attends religious service: Not on file    Active member of club or organization: Not on file    Attends meetings of clubs or organizations: Not on file    Relationship status: Not on file  .  Intimate partner violence:    Fear of current or ex partner: Not on file    Emotionally abused: Not on file    Physically abused: Not on file    Forced sexual activity: Not on file  Other Topics Concern  . Not on file  Social History Narrative   Married   Masters Degree   Caffeine- Soda diet Dr. Malachi Bonds 3 cans daily, no coffee    Children- 3    Pets: 1 dog       Review of Systems  Constitutional: Negative for chills, fatigue and unexpected weight change.  HENT: Negative for congestion, rhinorrhea, sneezing and sore throat.   Eyes: Negative for photophobia, pain and redness.  Respiratory: Negative for cough, chest tightness and shortness of breath.   Cardiovascular: Negative for chest pain and palpitations.  Gastrointestinal: Negative for abdominal pain, constipation, diarrhea, nausea and vomiting.  Endocrine: Negative.   Genitourinary: Negative for dysuria and frequency.  Musculoskeletal: Negative for arthralgias, back pain, joint swelling and neck pain.  Skin: Negative for rash.   Allergic/Immunologic: Negative.   Neurological: Negative for tremors and numbness.  Hematological: Negative for adenopathy. Does not bruise/bleed easily.  Psychiatric/Behavioral: Negative for behavioral problems and sleep disturbance. The patient is not nervous/anxious.     Vital Signs: BP 122/82   Pulse 86   Resp 16   Ht 5\' 5"  (1.651 m)   Wt 206 lb 6.4 oz (93.6 kg)   SpO2 98%   BMI 34.35 kg/m    Physical Exam  Constitutional: She is oriented to person, place, and time. She appears well-developed and well-nourished. No distress.  HENT:  Head: Normocephalic and atraumatic.  Mouth/Throat: Oropharynx is clear and moist. No oropharyngeal exudate.  Eyes: Pupils are equal, round, and reactive to light. EOM are normal.  Neck: Normal range of motion. Neck supple. No JVD present. No tracheal deviation present. No thyromegaly present.  Cardiovascular: Normal rate, regular rhythm and normal heart sounds. Exam reveals no gallop and no friction rub.  No murmur heard. Pulmonary/Chest: Effort normal and breath sounds normal. No respiratory distress. She has no wheezes. She has no rales. She exhibits no tenderness.  Abdominal: Soft. There is no tenderness. There is no guarding.  Musculoskeletal: Normal range of motion.  Lymphadenopathy:    She has no cervical adenopathy.  Neurological: She is alert and oriented to person, place, and time. No cranial nerve deficit.  Skin: Skin is warm and dry. She is not diaphoretic.  Psychiatric: She has a normal mood and affect. Her behavior is normal. Judgment and thought content normal.  Nursing note and vitals reviewed.  Assessment/Plan: 1. Generalized anxiety disorder Continue taking Lexapro as directed.   2. Seasonal allergies Continue to take Zyrtec as prescribed.   3. Morbid obesity ([place]) Obesity Counseling: Risk Assessment: An assessment of behavioral risk factors was made today and includes lack of exercise sedentary lifestyle, lack of  portion control and poor dietary habits.  Risk Modification Advice: She was counseled on portion control guidelines. Restricting daily caloric intake to. . The detrimental long term effects of obesity on her health and ongoing poor compliance was also discussed with the patient.    General Counseling: Kevia verbalizes understanding of the findings of todays visit and agrees with plan of treatment. I have discussed any further diagnostic evaluation that may be needed or ordered today. We also reviewed her medications today. she has been encouraged to call the office with any questions or concerns that should arise related to  todays visit.  No orders of the defined types were placed in this encounter.   No orders of the defined types were placed in this encounter.   Time spent: 25 Minutes   This patient was seen by Orson Gear AGNP-C in Collaboration with Dr Lavera Guise as a part of collaborative care agreement

## 2018-07-19 ENCOUNTER — Encounter: Payer: Self-pay | Admitting: Adult Health

## 2018-08-04 ENCOUNTER — Other Ambulatory Visit: Payer: Self-pay | Admitting: Nurse Practitioner

## 2018-08-22 ENCOUNTER — Other Ambulatory Visit: Payer: Self-pay | Admitting: Nurse Practitioner

## 2018-08-25 ENCOUNTER — Other Ambulatory Visit: Payer: Self-pay | Admitting: Nurse Practitioner

## 2018-08-25 ENCOUNTER — Ambulatory Visit: Payer: Self-pay | Admitting: Adult Health

## 2018-10-07 ENCOUNTER — Other Ambulatory Visit: Payer: Self-pay | Admitting: Nurse Practitioner

## 2018-10-16 ENCOUNTER — Other Ambulatory Visit: Payer: Self-pay | Admitting: Family

## 2019-02-13 ENCOUNTER — Other Ambulatory Visit: Payer: Self-pay

## 2019-02-13 MED ORDER — ESCITALOPRAM OXALATE 10 MG PO TABS: ORAL_TABLET | ORAL | 1 refills | Status: DC

## 2019-02-14 ENCOUNTER — Other Ambulatory Visit: Payer: Self-pay | Admitting: Family

## 2019-03-30 ENCOUNTER — Ambulatory Visit (INDEPENDENT_AMBULATORY_CARE_PROVIDER_SITE_OTHER): Payer: BC Managed Care – PPO | Admitting: Nurse Practitioner

## 2019-03-30 ENCOUNTER — Other Ambulatory Visit: Payer: Self-pay

## 2019-03-30 ENCOUNTER — Encounter: Payer: Self-pay | Admitting: Nurse Practitioner

## 2019-03-30 VITALS — BP 118/74 | HR 107 | Resp 16 | Ht 65.0 in | Wt 212.0 lb

## 2019-03-30 DIAGNOSIS — Z0001 Encounter for general adult medical examination with abnormal findings: Secondary | ICD-10-CM | POA: Diagnosis not present

## 2019-03-30 DIAGNOSIS — F411 Generalized anxiety disorder: Secondary | ICD-10-CM | POA: Diagnosis not present

## 2019-03-30 DIAGNOSIS — Z01419 Encounter for gynecological examination (general) (routine) without abnormal findings: Secondary | ICD-10-CM

## 2019-03-30 DIAGNOSIS — R300 Dysuria: Secondary | ICD-10-CM

## 2019-03-30 MED ORDER — ESCITALOPRAM OXALATE 10 MG PO TABS: ORAL_TABLET | ORAL | 1 refills | Status: DC

## 2019-03-30 NOTE — Progress Notes (Signed)
[facility] [place], [place] [address]  Internal MEDICINE  Office Visit Note  Patient Name: Sophia Garrett  [id number]  [id number]  Date of Service: 04/26/2019   Pt is here for routine health maintenance examination   Chief Complaint  Patient presents with  . Annual Exam  . Gynecologic Exam  . Anxiety  . Depression     The patient is here for health maintenance exam. She has history of anxiety and depression. Takes lexapro. Doing very well on current dose. She is due to have her pao smear today. She is also due for routine, fasting labs.    Current Medication: Outpatient Encounter Medications as of 03/30/2019  Medication Sig  . cetirizine (ZYRTEC ALLERGY) 10 MG tablet Take 10 mg by mouth daily.  Marland Kitchen escitalopram (LEXAPRO) 10 MG tablet Take 1.5 tablet po QD  . [DISCONTINUED] escitalopram (LEXAPRO) 10 MG tablet TAKE 1 AND 1/2 TABLETS DAILY BY MOUTH   No facility-administered encounter medications on file as of 03/30/2019.     Surgical History: History reviewed. No pertinent surgical history.  Medical History: Past Medical History:  Diagnosis Date  . Allergy   . Anxiety     Family History: Family History  Problem Relation Age of Onset  . Hyperlipidemia Mother   . Skin cancer Mother        'not melanoma'  . Alcohol abuse Father   . Heart disease Father   . Hypertension Father   . Cancer Maternal Aunt        Breast Cancer  . Cancer Paternal Aunt        Breast Cancer  . Cancer Maternal Grandmother        Breast Cancer  . Cancer Maternal Grandfather        Prostate/Lung Cancer  . Cancer Paternal Grandmother        Breast Cancer  . Cancer Paternal Grandfather        Prostate Cancer  . Cancer Paternal Aunt        Breast Cancer  . Colon cancer Neg Hx       Review of Systems  Constitutional: Negative for chills, fatigue and unexpected weight change.  HENT: Negative for congestion, rhinorrhea, sneezing and sore throat.    Respiratory: Negative for cough, chest tightness and shortness of breath.   Cardiovascular: Negative for chest pain and palpitations.  Gastrointestinal: Negative for abdominal pain, constipation, diarrhea, nausea and vomiting.  Endocrine: Negative for cold intolerance, heat intolerance, polydipsia and polyuria.  Genitourinary: Negative for dysuria and frequency.  Musculoskeletal: Negative for arthralgias, back pain, joint swelling and neck pain.  Skin: Negative for rash.  Allergic/Immunologic: Negative.   Neurological: Negative for dizziness, tremors, numbness and headaches.  Hematological: Negative for adenopathy. Does not bruise/bleed easily.  Psychiatric/Behavioral: Negative for behavioral problems and sleep disturbance. The patient is nervous/anxious.        Well controlled with current medication.      Today's Vitals   03/30/19 1457  BP: 118/74  Pulse: (!) 107  Resp: 16  SpO2: 99%  Weight: 212 lb (96.2 kg)  Height: 5\' 5"  (1.651 m)   Body mass index is 35.28 kg/m.  Physical Exam Vitals signs and nursing note reviewed.  Constitutional:      General: She is not in acute distress.    Appearance: Normal appearance. She is well-developed. She is not diaphoretic.  HENT:     Head: Normocephalic and atraumatic.     Mouth/Throat:  Pharynx: No oropharyngeal exudate.  Eyes:     Conjunctiva/sclera: Conjunctivae normal.     Pupils: Pupils are equal, round, and reactive to light.  Neck:     Musculoskeletal: Normal range of motion and neck supple.     Thyroid: No thyromegaly.     Vascular: No JVD.     Trachea: No tracheal deviation.  Cardiovascular:     Rate and Rhythm: Normal rate and regular rhythm.     Pulses: Normal pulses.     Heart sounds: Normal heart sounds. No murmur. No friction rub. No gallop.   Pulmonary:     Effort: Pulmonary effort is normal. No respiratory distress.     Breath sounds: Normal breath sounds. No wheezing or rales.  Chest:     Chest wall: No  tenderness.     Breasts:        Right: Normal. No swelling, bleeding, inverted nipple, mass, nipple discharge, skin change or tenderness.        Left: Normal. No swelling, bleeding, inverted nipple, mass, nipple discharge, skin change or tenderness.  Abdominal:     General: Bowel sounds are normal.     Palpations: Abdomen is soft.     Tenderness: There is no abdominal tenderness. There is no guarding.  Genitourinary:    General: Normal vulva.     Exam position: Supine.     Labia:        Right: No tenderness.        Left: No tenderness.      Vagina: Normal. No vaginal discharge, erythema or tenderness.     Cervix: No cervical motion tenderness, discharge, friability or erythema.     Uterus: Normal.      Adnexa: Right adnexa normal and left adnexa normal.     Comments: No tenderness, masses, or organomeglay present during bimanual exam . Musculoskeletal: Normal range of motion.  Lymphadenopathy:     Cervical: No cervical adenopathy.     Lower Body: No right inguinal adenopathy. No left inguinal adenopathy.  Skin:    General: Skin is warm and dry.  Neurological:     Mental Status: She is alert and oriented to person, place, and time.     Cranial Nerves: No cranial nerve deficit.  Psychiatric:        Behavior: Behavior normal.        Thought Content: Thought content normal.        Judgment: Judgment normal.      LABS: Recent Results (from the past 2160 hour(s))  UA/M w/rflx Culture, Routine     Status: Abnormal   Collection Time: 03/30/19  2:58 PM  Result Value Ref Range   Specific Gravity, UA 1.023 1.005 - 1.030   pH, UA 5.0 5.0 - 7.5   Color, UA Yellow Yellow   Appearance Ur Turbid (A) Clear   Leukocytes,UA 1+ (A) Negative   Protein,UA Negative Negative/Trace   Glucose, UA Negative Negative   Ketones, UA Negative Negative   RBC, UA 2+ (A) Negative   Bilirubin, UA Negative Negative   Urobilinogen, Ur 0.2 0.2 - 1.0 mg/dL   Nitrite, UA Negative Negative   Microscopic  Examination See below:     Comment: Microscopic was indicated and was performed.   Urinalysis Reflex Comment     Comment: This specimen has reflexed to a Urine Culture.  Microscopic Examination     Status: Abnormal   Collection Time: 03/30/19  2:58 PM  Result Value Ref Range   WBC, UA  6-10 (A) 0 - 5 /hpf   RBC 3-10 (A) 0 - 2 /hpf   Epithelial Cells (non renal) >10 (A) 0 - 10 /hpf   Casts None seen None seen /lpf   Mucus, UA Present Not Estab.   Bacteria, UA Moderate (A) None seen/Few  Urine Culture, Reflex     Status: None   Collection Time: 03/30/19  2:58 PM  Result Value Ref Range   Urine Culture, Routine Final report    Organism ID, Bacteria Comment     Comment: Culture shows less than 10,000 colony forming units of bacteria per milliliter of urine. This colony count is not generally considered to be clinically significant.   Pap IG and HPV (high risk) DNA detection     Status: None   Collection Time: 03/30/19  3:09 PM  Result Value Ref Range   Interpretation NILM     Comment: NEGATIVE FOR INTRAEPITHELIAL LESION OR MALIGNANCY.   Category NIL     Comment: Negative for Intraepithelial Lesion   Adequacy SECNI     Comment: Satisfactory for evaluation. No endocervical component is identified.   Clinician Provided ICD10 Comment     Comment: Z01.419   Performed by: Comment     Comment: Darrin Nipper, Cytotechnologist (ASCP)   Note: Comment     Comment: The Pap smear is a screening test designed to aid in the detection of premalignant and malignant conditions of the uterine cervix.  It is not a diagnostic procedure and should not be used as the sole means of detecting cervical cancer.  Both false-positive and false-negative reports do occur.    Test Methodology Comment     Comment: This liquid based ThinPrep(R) pap test was screened with the use of an image guided system.    HPV, high-risk Negative Negative    Comment: This nucleic acid amplification high-risk HPV test  detects thirteen high-risk types (16,18,31,33,35,39,45,51,52,56,58,59,68) without differentiation.    Assessment/Plan:  1. Encounter for general adult medical examination with abnormal findings Annual health maintenance exam today. Lab slip given to check routine, fasting labs today  2. Generalized anxiety disorder Stable. Continue lexapro 15mg  daily. Refill sent to her pharmacy.  - escitalopram (LEXAPRO) 10 MG tablet; Take 1.5 tablet po QD  Dispense: 135 tablet; Refill: 1  3. Encounter for cervical Pap smear with pelvic exam - Pap IG and HPV (high risk) DNA detection  4. Dysuria - UA/M w/rflx Culture, Routine    General Counseling: Briar verbalizes understanding of the findings of todays visit and agrees with plan of treatment. I have discussed any further diagnostic evaluation that may be needed or ordered today. We also reviewed her medications today. she has been encouraged to call the office with any questions or concerns that should arise related to todays visit.    Counseling:  This patient was seen by Leretha Pol FNP Collaboration with Dr Lavera Guise as a part of collaborative care agreement  Orders Placed This Encounter  Procedures  . Microscopic Examination  . Urine Culture, Reflex  . UA/M w/rflx Culture, Routine    Meds ordered this encounter  Medications  . escitalopram (LEXAPRO) 10 MG tablet    Sig: Take 1.5 tablet po QD    Dispense:  135 tablet    Refill:  1    Order Specific Question:   Supervising Provider    Answer:   Lavera Guise [[id number]]    Time spent: [place]      Lavera Guise, MD  Internal Medicine

## 2019-04-01 LAB — UA/M W/RFLX CULTURE, ROUTINE
Bilirubin, UA: NEGATIVE
Glucose, UA: NEGATIVE
Ketones, UA: NEGATIVE
Nitrite, UA: NEGATIVE
Protein,UA: NEGATIVE
Specific Gravity, UA: 1.023 (ref 1.005–1.030)
Urobilinogen, Ur: 0.2 mg/dL (ref 0.2–1.0)
pH, UA: 5 (ref 5.0–7.5)

## 2019-04-01 LAB — URINE CULTURE, REFLEX

## 2019-04-01 LAB — MICROSCOPIC EXAMINATION
Casts: NONE SEEN /lpf
Epithelial Cells (non renal): >10 /hpf — AB (ref 0–10)

## 2019-04-02 LAB — PAP IG AND HPV HIGH-RISK: HPV, high-risk: NEGATIVE

## 2019-04-03 ENCOUNTER — Telehealth: Payer: Self-pay

## 2019-04-03 NOTE — Progress Notes (Signed)
Pt was notified.  

## 2019-04-03 NOTE — Telephone Encounter (Signed)
Pt was notified that pap smear was normal.

## 2019-04-26 DIAGNOSIS — R300 Dysuria: Secondary | ICD-10-CM | POA: Insufficient documentation

## 2019-08-22 ENCOUNTER — Other Ambulatory Visit: Payer: Self-pay | Admitting: Adult Health

## 2019-08-22 DIAGNOSIS — F411 Generalized anxiety disorder: Secondary | ICD-10-CM

## 2019-09-01 ENCOUNTER — Other Ambulatory Visit: Payer: Self-pay | Admitting: Adult Health

## 2019-09-01 MED ORDER — FLUCONAZOLE 150 MG PO TABS: ORAL_TABLET | ORAL | 0 refills | Status: DC

## 2019-10-05 ENCOUNTER — Ambulatory Visit: Payer: BC Managed Care – PPO | Admitting: Adult Health

## 2019-10-24 ENCOUNTER — Ambulatory Visit: Payer: BC Managed Care – PPO | Admitting: Adult Health

## 2019-10-24 ENCOUNTER — Telehealth: Payer: Self-pay

## 2019-10-24 ENCOUNTER — Other Ambulatory Visit: Payer: Self-pay

## 2019-10-24 ENCOUNTER — Encounter: Payer: Self-pay | Admitting: Adult Health

## 2019-10-24 VITALS — BP 120/80 | HR 81 | Temp 97.9°F | Resp 16 | Ht 65.0 in | Wt 210.0 lb

## 2019-10-24 DIAGNOSIS — J302 Other seasonal allergic rhinitis: Secondary | ICD-10-CM | POA: Diagnosis not present

## 2019-10-24 DIAGNOSIS — F411 Generalized anxiety disorder: Secondary | ICD-10-CM | POA: Diagnosis not present

## 2019-10-24 MED ORDER — ESCITALOPRAM OXALATE 10 MG PO TABS: ORAL_TABLET | ORAL | 3 refills | Status: DC

## 2019-10-24 NOTE — Telephone Encounter (Signed)
Patient will call back to schd 4 month follow up from appt 10/24/2019. Sophia Garrett

## 2019-10-24 NOTE — Progress Notes (Signed)
[facility] [place], [place] [address]  Internal MEDICINE  Office Visit Note  Patient Name: Sophia Garrett  [id number]  [id number]  Date of Service: 10/24/2019  Chief Complaint  Patient presents with  . Anxiety    HPI  Pt is here for follow up on Anxiety. She reports overall she is doing well. She is doing well with her current dose of Lexapro.  She denies any side effects.  She continues to take zyrtec for her allergies, and feels they are well controlled.      Current Medication: Outpatient Encounter Medications as of 10/24/2019  Medication Sig  . cetirizine (ZYRTEC ALLERGY) 10 MG tablet Take 10 mg by mouth daily.  Marland Kitchen escitalopram (LEXAPRO) 10 MG tablet TAKE 1 AND 1/2 TABLETS BY MOUTH DAILY  . [DISCONTINUED] escitalopram (LEXAPRO) 10 MG tablet TAKE 1 AND 1/2 TABLETS BY MOUTH DAILY  . fluconazole (DIFLUCAN) 150 MG tablet Take one tablet by mouth  , repeat dos if in three days symptoms persist (Patient not taking: Reported on 10/24/2019)   No facility-administered encounter medications on file as of 10/24/2019.     Surgical History: History reviewed. No pertinent surgical history.  Medical History: Past Medical History:  Diagnosis Date  . Allergy   . Anxiety     Family History: Family History  Problem Relation Age of Onset  . Hyperlipidemia Mother   . Skin cancer Mother        'not melanoma'  . Alcohol abuse Father   . Heart disease Father   . Hypertension Father   . Cancer Maternal Aunt        Breast Cancer  . Cancer Paternal Aunt        Breast Cancer  . Cancer Maternal Grandmother        Breast Cancer  . Cancer Maternal Grandfather        Prostate/Lung Cancer  . Cancer Paternal Grandmother        Breast Cancer  . Cancer Paternal Grandfather        Prostate Cancer  . Cancer Paternal Aunt        Breast Cancer  . Colon cancer Neg Hx     Social History   Socioeconomic History  . Marital status: Married    Spouse name: Not  on file  . Number of children: Not on file  . Years of education: Not on file  . Highest education level: Not on file  Occupational History  . Not on file  Social Needs  . Financial resource strain: Not on file  . Food insecurity    Worry: Not on file    Inability: Not on file  . Transportation needs    Medical: Not on file    Non-medical: Not on file  Tobacco Use  . Smoking status: Never Smoker  . Smokeless tobacco: Never Used  Substance and Sexual Activity  . Alcohol use: Yes    Alcohol/week: 0.0 standard drinks    Comment: Occasionaly   . Drug use: No  . Sexual activity: Yes    Partners: Male    Birth control/protection: None    Comment: Husband  Lifestyle  . Physical activity    Days per week: Not on file    Minutes per session: Not on file  . Stress: Not on file  Relationships  . Social Herbalist on phone: Not on file    Gets together: Not on file    Attends religious  service: Not on file    Active member of club or organization: Not on file    Attends meetings of clubs or organizations: Not on file    Relationship status: Not on file  . Intimate partner violence    Fear of current or ex partner: Not on file    Emotionally abused: Not on file    Physically abused: Not on file    Forced sexual activity: Not on file  Other Topics Concern  . Not on file  Social History Narrative   Married   Masters Degree   Caffeine- Soda diet Dr. Malachi Bonds 3 cans daily, no coffee    Children- 3    Pets: 1 dog        Review of Systems  Constitutional: Negative for chills, fatigue and unexpected weight change.  HENT: Negative for congestion, rhinorrhea, sneezing and sore throat.   Eyes: Negative for photophobia, pain and redness.  Respiratory: Negative for cough, chest tightness and shortness of breath.   Cardiovascular: Negative for chest pain and palpitations.  Gastrointestinal: Negative for abdominal pain, constipation, diarrhea, nausea and vomiting.   Endocrine: Negative.   Genitourinary: Negative for dysuria and frequency.  Musculoskeletal: Negative for arthralgias, back pain, joint swelling and neck pain.  Skin: Negative for rash.  Allergic/Immunologic: Negative.   Neurological: Negative for tremors and numbness.  Hematological: Negative for adenopathy. Does not bruise/bleed easily.  Psychiatric/Behavioral: Negative for behavioral problems and sleep disturbance. The patient is not nervous/anxious.     Vital Signs: BP 120/80   Pulse 81   Temp 97.9 F (36.6 C)   Resp 16   Ht 5\' 5"  (1.651 m)   Wt 210 lb (95.3 kg)   SpO2 99%   BMI 34.95 kg/m    Physical Exam Vitals signs and nursing note reviewed.  Constitutional:      General: She is not in acute distress.    Appearance: She is well-developed. She is not diaphoretic.  HENT:     Head: Normocephalic and atraumatic.     Mouth/Throat:     Pharynx: No oropharyngeal exudate.  Eyes:     Pupils: Pupils are equal, round, and reactive to light.  Neck:     Musculoskeletal: Normal range of motion and neck supple.     Thyroid: No thyromegaly.     Vascular: No JVD.     Trachea: No tracheal deviation.  Cardiovascular:     Rate and Rhythm: Normal rate and regular rhythm.     Heart sounds: Normal heart sounds. No murmur. No friction rub. No gallop.   Pulmonary:     Effort: Pulmonary effort is normal. No respiratory distress.     Breath sounds: Normal breath sounds. No wheezing or rales.  Chest:     Chest wall: No tenderness.  Abdominal:     Palpations: Abdomen is soft.     Tenderness: There is no abdominal tenderness. There is no guarding.  Musculoskeletal: Normal range of motion.  Lymphadenopathy:     Cervical: No cervical adenopathy.  Skin:    General: Skin is warm and dry.  Neurological:     Mental Status: She is alert and oriented to person, place, and time.     Cranial Nerves: No cranial nerve deficit.  Psychiatric:        Behavior: Behavior normal.        Thought  Content: Thought content normal.        Judgment: Judgment normal.     Assessment/Plan: 1. Generalized anxiety  disorder Continue Lexapro as discussed. - escitalopram (LEXAPRO) 10 MG tablet; TAKE 1 AND 1/2 TABLETS BY MOUTH DAILY  Dispense: 45 tablet; Refill: 3  2. Seasonal allergies Stable, continue current medications as directed.    3. Morbid obesity (Belt) Obesity Counseling: Risk Assessment: An assessment of behavioral risk factors was made today and includes lack of exercise sedentary lifestyle, lack of portion control and poor dietary habits.  Risk Modification Advice: She was counseled on portion control guidelines. Restricting daily caloric intake to. . The detrimental long term effects of obesity on her health and ongoing poor compliance was also discussed with the patient.    General Counseling: Keeli verbalizes understanding of the findings of todays visit and agrees with plan of treatment. I have discussed any further diagnostic evaluation that may be needed or ordered today. We also reviewed her medications today. she has been encouraged to call the office with any questions or concerns that should arise related to todays visit.    No orders of the defined types were placed in this encounter.   Meds ordered this encounter  Medications  . escitalopram (LEXAPRO) 10 MG tablet    Sig: TAKE 1 AND 1/2 TABLETS BY MOUTH DAILY    Dispense:  45 tablet    Refill:  3    Time spent: 25 Minutes   This patient was seen by Orson Gear AGNP-C in Collaboration with Dr Lavera Guise as a part of collaborative care agreement     Kendell Bane AGNP-C Internal medicine

## 2020-02-21 ENCOUNTER — Other Ambulatory Visit: Payer: Self-pay | Admitting: Adult Health

## 2020-02-21 DIAGNOSIS — F411 Generalized anxiety disorder: Secondary | ICD-10-CM

## 2020-04-03 ENCOUNTER — Telehealth: Payer: Self-pay

## 2020-04-03 NOTE — Telephone Encounter (Signed)
Confirmed and screened for 04-05-20 ov. °

## 2020-04-04 ENCOUNTER — Telehealth: Payer: Self-pay

## 2020-04-04 NOTE — Telephone Encounter (Signed)
Patient cancelled appointment on 04/05/2020 will call back to reschedule. klh

## 2020-04-05 ENCOUNTER — Encounter: Payer: BC Managed Care – PPO | Admitting: Nurse Practitioner

## 2020-08-05 ENCOUNTER — Other Ambulatory Visit: Payer: Self-pay

## 2020-08-05 DIAGNOSIS — F411 Generalized anxiety disorder: Secondary | ICD-10-CM

## 2020-08-05 MED ORDER — ESCITALOPRAM OXALATE 10 MG PO TABS: 15.0000 mg | ORAL_TABLET | Freq: Every day | ORAL | 0 refills | Status: DC

## 2020-08-21 ENCOUNTER — Other Ambulatory Visit: Payer: Self-pay

## 2020-08-21 ENCOUNTER — Encounter: Payer: Self-pay | Admitting: Hospice and Palliative Medicine

## 2020-08-21 ENCOUNTER — Ambulatory Visit (INDEPENDENT_AMBULATORY_CARE_PROVIDER_SITE_OTHER): Payer: BC Managed Care – PPO | Admitting: Hospice and Palliative Medicine

## 2020-08-21 VITALS — BP 129/77 | HR 90 | Temp 98.3°F | Resp 16 | Ht 65.0 in | Wt 207.2 lb

## 2020-08-21 DIAGNOSIS — R300 Dysuria: Secondary | ICD-10-CM

## 2020-08-21 DIAGNOSIS — Z1231 Encounter for screening mammogram for malignant neoplasm of breast: Secondary | ICD-10-CM | POA: Diagnosis not present

## 2020-08-21 DIAGNOSIS — Z0001 Encounter for general adult medical examination with abnormal findings: Secondary | ICD-10-CM

## 2020-08-21 DIAGNOSIS — F411 Generalized anxiety disorder: Secondary | ICD-10-CM

## 2020-08-21 DIAGNOSIS — Z23 Encounter for immunization: Secondary | ICD-10-CM

## 2020-08-21 MED ORDER — ESCITALOPRAM OXALATE 10 MG PO TABS: 15.0000 mg | ORAL_TABLET | Freq: Every day | ORAL | 3 refills | Status: DC

## 2020-08-21 NOTE — Progress Notes (Signed)
[facility] [place], [place] [address]  Internal MEDICINE  Office Visit Note  Patient Name: Sophia Garrett  [id number]  [id number]  Date of Service: 08/22/2020  Chief Complaint  Patient presents with  . Annual Exam  . Quality Metric Gaps    HepC, TDAP     HPI Pt is here for routine health maintenance examination Overall she feels as though she has been doing well and has had no changes in her health or medications since last seen. Over the last several months she has been focusing on her eating habits and has started walking more frequently. Anxiety/depression-she feels her emotions have been well controlled on Lexapro. She is sleeping well at this time, averaging 7-8 hours of uninterrupted sleep per night. No acute issues to discuss today  Current Medication: Outpatient Encounter Medications as of 08/21/2020  Medication Sig  . cetirizine (ZYRTEC ALLERGY) 10 MG tablet Take 10 mg by mouth daily.  Marland Kitchen escitalopram (LEXAPRO) 10 MG tablet Take 1.5 tablets (15 mg total) by mouth daily.  . [DISCONTINUED] escitalopram (LEXAPRO) 10 MG tablet Take 1.5 tablets (15 mg total) by mouth daily.  . [DISCONTINUED] fluconazole (DIFLUCAN) 150 MG tablet Take one tablet by mouth  , repeat dos if in three days symptoms persist (Patient not taking: Reported on 10/24/2019)   No facility-administered encounter medications on file as of 08/21/2020.    Surgical History: History reviewed. No pertinent surgical history.  Medical History: Past Medical History:  Diagnosis Date  . Allergy   . Anxiety     Family History: Family History  Problem Relation Age of Onset  . Hyperlipidemia Mother   . Skin cancer Mother        'not melanoma'  . Alcohol abuse Father   . Heart disease Father   . Hypertension Father   . Cancer Maternal Aunt        Breast Cancer  . Cancer Paternal Aunt        Breast Cancer  . Cancer Maternal Grandmother        Breast Cancer  . Cancer Maternal  Grandfather        Prostate/Lung Cancer  . Cancer Paternal Grandmother        Breast Cancer  . Cancer Paternal Grandfather        Prostate Cancer  . Cancer Paternal Aunt        Breast Cancer  . Colon cancer Neg Hx     Review of Systems  Constitutional: Negative for chills, diaphoresis and fatigue.  HENT: Negative for ear pain, postnasal drip, sinus pressure and sore throat.   Eyes: Negative for photophobia, discharge, redness, itching and visual disturbance.  Respiratory: Negative for cough, shortness of breath and wheezing.   Cardiovascular: Negative for chest pain, palpitations and leg swelling.  Gastrointestinal: Negative for abdominal pain, constipation, diarrhea, nausea and vomiting.  Genitourinary: Negative for dysuria and flank pain.  Musculoskeletal: Negative for arthralgias, back pain, gait problem and neck pain.  Skin: Negative for color change.  Allergic/Immunologic: Negative for environmental allergies and food allergies.  Neurological: Negative for dizziness and headaches.  Hematological: Does not bruise/bleed easily.  Psychiatric/Behavioral: Negative for agitation, behavioral problems (depression) and hallucinations.     Vital Signs: BP 129/77   Pulse 90   Temp 98.3 F (36.8 C)   Resp 16   Ht 5\' 5"  (1.651 m)   Wt 207 lb 3.2 oz (94 kg)   SpO2 100%   BMI 34.48 kg/m  Physical Exam Vitals reviewed.  Constitutional:      Appearance: Normal appearance.  HENT:     Mouth/Throat:     Mouth: Mucous membranes are moist.     Pharynx: Oropharynx is clear.  Cardiovascular:     Rate and Rhythm: Normal rate and regular rhythm.     Pulses: Normal pulses.     Heart sounds: Normal heart sounds.  Pulmonary:     Effort: Pulmonary effort is normal.     Breath sounds: Normal breath sounds.  Chest:     Breasts:        Right: Normal.        Left: Normal.  Abdominal:     General: Abdomen is flat.     Palpations: Abdomen is soft.  Musculoskeletal:        General:  Normal range of motion.     Cervical back: Normal range of motion.  Skin:    General: Skin is warm.  Neurological:     General: No focal deficit present.     Mental Status: She is alert and oriented to person, place, and time. Mental status is at baseline.  Psychiatric:        Mood and Affect: Mood normal.        Behavior: Behavior normal.        Thought Content: Thought content normal.      LABS: Recent Results (from the past 2160 hour(s))  UA/M w/rflx Culture, Routine     Status: Abnormal   Collection Time: 08/21/20 10:16 AM   Specimen: Urine   Urine  Result Value Ref Range   Specific Gravity, UA 1.005 1.005 - 1.030   pH, UA 6.0 5.0 - 7.5   Color, UA Yellow Yellow   Appearance Ur Clear Clear   Leukocytes,UA Negative Negative   Protein,UA Negative Negative/Trace   Glucose, UA Negative Negative   Ketones, UA Negative Negative   RBC, UA 2+ (A) Negative   Bilirubin, UA Negative Negative   Urobilinogen, Ur 0.2 0.2 - 1.0 mg/dL   Nitrite, UA Negative Negative   Microscopic Examination See below:     Comment: Microscopic was indicated and was performed.   Urinalysis Reflex Comment     Comment: This specimen will not reflex to a Urine Culture.  Microscopic Examination     Status: None   Collection Time: 08/21/20 10:16 AM   Urine  Result Value Ref Range   WBC, UA None seen 0 - 5 /hpf   RBC 0-2 0 - 2 /hpf   Epithelial Cells (non renal) None seen 0 - 10 /hpf   Casts None seen None seen /lpf   Bacteria, UA None seen None seen/Few    Assessment/Plan: 1. Encounter for routine adult health examination with abnormal findings Well appearing 39 year old female. Order placed for mammogram, PAP 2020 normal, up to date on all other PHM. Will review labs at next follow-up visit. - CBC w/Diff/Platelet - Comprehensive Metabolic Panel (CMET) - Lipid Panel With LDL/HDL Ratio - TSH + free T4 - Vitamin D 1,25 dihydroxy - B12  2. Generalized anxiety disorder Reports her symptoms are  stable at this time, will continue with current therapy and monitoring. Requesting refills today. - escitalopram (LEXAPRO) 10 MG tablet; Take 1.5 tablets (15 mg total) by mouth daily.  Dispense: 45 tablet; Refill: 3  3. Encounter for screening mammogram for malignant neoplasm of breast Order placed for mammogram screening. Will follow-up with results. - MM Digital Screening; Future  4.  Dysuria - UA/M w/rflx Culture, Routine  5. Flu vaccine need - Flu Vaccine MDCK QUAD PF  General Counseling: Sophia Garrett verbalizes understanding of the findings of todays visit and agrees with plan of treatment. I have discussed any further diagnostic evaluation that may be needed or ordered today. We also reviewed her medications today. she has been encouraged to call the office with any questions or concerns that should arise related to todays visit.  Orders Placed This Encounter  Procedures  . Microscopic Examination  . MM Digital Screening  . Flu Vaccine MDCK QUAD PF  . UA/M w/rflx Culture, Routine  . CBC w/Diff/Platelet  . Comprehensive Metabolic Panel (CMET)  . Lipid Panel With LDL/HDL Ratio  . TSH + free T4  . Vitamin D 1,25 dihydroxy  . B12    Meds ordered this encounter  Medications  . escitalopram (LEXAPRO) 10 MG tablet    Sig: Take 1.5 tablets (15 mg total) by mouth daily.    Dispense:  45 tablet    Refill:  3    Total time spent: 35 Minutes  Time spent includes review of chart, medications, test results, and follow up plan with the patient.   This patient was seen by Casey Burkitt AGNP-C Collaboration with Dr Lavera Guise as a part of collaborative care agreement   Tanna Furry. [facility] Internal Medicine

## 2020-08-22 ENCOUNTER — Encounter: Payer: Self-pay | Admitting: Hospice and Palliative Medicine

## 2020-08-22 LAB — MICROSCOPIC EXAMINATION
Bacteria, UA: NONE SEEN
Casts: NONE SEEN /lpf
Epithelial Cells (non renal): NONE SEEN /hpf (ref 0–10)
WBC, UA: NONE SEEN /hpf (ref 0–5)

## 2020-08-22 LAB — UA/M W/RFLX CULTURE, ROUTINE
Bilirubin, UA: NEGATIVE
Glucose, UA: NEGATIVE
Ketones, UA: NEGATIVE
Leukocytes,UA: NEGATIVE
Nitrite, UA: NEGATIVE
Protein,UA: NEGATIVE
Specific Gravity, UA: 1.005 (ref 1.005–1.030)
Urobilinogen, Ur: 0.2 mg/dL (ref 0.2–1.0)
pH, UA: 6 (ref 5.0–7.5)

## 2020-09-25 ENCOUNTER — Ambulatory Visit: Payer: BC Managed Care – PPO | Admitting: Hospice and Palliative Medicine

## 2020-09-25 ENCOUNTER — Telehealth: Payer: Self-pay

## 2020-09-25 NOTE — Telephone Encounter (Signed)
Called pt to RS. Pt schd 11/11 and will get labs done the week prior .. kdm

## 2020-10-24 ENCOUNTER — Ambulatory Visit: Payer: BC Managed Care – PPO | Admitting: Hospice and Palliative Medicine

## 2020-10-24 ENCOUNTER — Other Ambulatory Visit: Payer: Self-pay

## 2020-10-24 ENCOUNTER — Encounter: Payer: Self-pay | Admitting: Hospice and Palliative Medicine

## 2020-10-24 DIAGNOSIS — Z6834 Body mass index (BMI) 34.0-34.9, adult: Secondary | ICD-10-CM | POA: Diagnosis not present

## 2020-10-24 DIAGNOSIS — E782 Mixed hyperlipidemia: Secondary | ICD-10-CM

## 2020-10-24 DIAGNOSIS — F411 Generalized anxiety disorder: Secondary | ICD-10-CM | POA: Diagnosis not present

## 2020-10-24 NOTE — Progress Notes (Signed)
[facility] [place], [place] [address]  Internal MEDICINE  Office Visit Note  Patient Name: Sophia Garrett  [id number]  [id number]  Date of Service: 10/29/2020  Chief Complaint  Patient presents with  . Follow-up    review labs  . Anxiety  . policy update form    received    HPI Patient is here for routine follow-up She has been doing well Reviewed her recent labs with her--abnormal lipid panel, not a significant change since last labs were checked in 2016 She is considering weaning herself off of Lexapro as she does not feel she needs it--anxiety well controlled, has only been taking 1 tablet for a while and has been off of it before during her pregnancies and felt fine without it  Current Medication: Outpatient Encounter Medications as of 10/24/2020  Medication Sig  . cetirizine (ZYRTEC ALLERGY) 10 MG tablet Take 10 mg by mouth daily.  Marland Kitchen escitalopram (LEXAPRO) 10 MG tablet Take 1.5 tablets (15 mg total) by mouth daily.   No facility-administered encounter medications on file as of 10/24/2020.    Surgical History: History reviewed. No pertinent surgical history.  Medical History: Past Medical History:  Diagnosis Date  . Allergy   . Anxiety     Family History: Family History  Problem Relation Age of Onset  . Hyperlipidemia Mother   . Skin cancer Mother        'not melanoma'  . Alcohol abuse Father   . Heart disease Father   . Hypertension Father   . Cancer Maternal Aunt        Breast Cancer  . Cancer Paternal Aunt        Breast Cancer  . Cancer Maternal Grandmother        Breast Cancer  . Cancer Maternal Grandfather        Prostate/Lung Cancer  . Cancer Paternal Grandmother        Breast Cancer  . Cancer Paternal Grandfather        Prostate Cancer  . Cancer Paternal Aunt        Breast Cancer  . Colon cancer Neg Hx     Social History   Socioeconomic History  . Marital status: Married    Spouse name: Not on file  .  Number of children: Not on file  . Years of education: Not on file  . Highest education level: Not on file  Occupational History  . Not on file  Tobacco Use  . Smoking status: Never Smoker  . Smokeless tobacco: Never Used  Vaping Use  . Vaping Use: Never used  Substance and Sexual Activity  . Alcohol use: Yes    Alcohol/week: 0.0 standard drinks    Comment: Occasionaly   . Drug use: No  . Sexual activity: Yes    Partners: Male    Birth control/protection: None    Comment: Husband  Other Topics Concern  . Not on file  Social History Narrative   Married   Masters Degree   Caffeine- Soda diet Dr. Malachi Bonds 3 cans daily, no coffee    Children- 3    Pets: 1 dog     Social Determinants of Health   Financial Resource Strain:   . Difficulty of Paying Living Expenses: Not on file  Food Insecurity:   . Worried About Charity fundraiser in the Last Year: Not on file  . Ran Out of Food in the Last Year: Not on file  Transportation Needs:   .  Lack of Transportation (Medical): Not on file  . Lack of Transportation (Non-Medical): Not on file  Physical Activity:   . Days of Exercise per Week: Not on file  . Minutes of Exercise per Session: Not on file  Stress:   . Feeling of Stress : Not on file  Social Connections:   . Frequency of Communication with Friends and Family: Not on file  . Frequency of Social Gatherings with Friends and Family: Not on file  . Attends Religious Services: Not on file  . Active Member of Clubs or Organizations: Not on file  . Attends Archivist Meetings: Not on file  . Marital Status: Not on file  Intimate Partner Violence:   . Fear of Current or Ex-Partner: Not on file  . Emotionally Abused: Not on file  . Physically Abused: Not on file  . Sexually Abused: Not on file      Review of Systems  Constitutional: Negative for chills, diaphoresis and fatigue.  HENT: Negative for ear pain, postnasal drip and sinus pressure.   Eyes: Negative for  photophobia, discharge, redness, itching and visual disturbance.  Respiratory: Negative for cough, shortness of breath and wheezing.   Cardiovascular: Negative for chest pain, palpitations and leg swelling.  Gastrointestinal: Negative for abdominal pain, constipation, diarrhea, nausea and vomiting.  Genitourinary: Negative for dysuria and flank pain.  Musculoskeletal: Negative for arthralgias, back pain, gait problem and neck pain.  Skin: Negative for color change.  Allergic/Immunologic: Negative for environmental allergies and food allergies.  Neurological: Negative for dizziness and headaches.  Hematological: Does not bruise/bleed easily.  Psychiatric/Behavioral: Negative for agitation, behavioral problems (depression) and hallucinations.    Vital Signs: BP 104/82   Pulse 86   Temp (!) 97.5 F (36.4 C)   Resp 16   Ht 5\' 5"  (1.651 m)   Wt 208 lb 6.4 oz (94.5 kg)   SpO2 99%   BMI 34.68 kg/m    Physical Exam Vitals reviewed.  Constitutional:      Appearance: Normal appearance.  Cardiovascular:     Rate and Rhythm: Normal rate and regular rhythm.     Pulses: Normal pulses.     Heart sounds: Normal heart sounds.  Pulmonary:     Effort: Pulmonary effort is normal.     Breath sounds: Normal breath sounds.  Musculoskeletal:        General: Normal range of motion.     Cervical back: Normal range of motion.  Skin:    General: Skin is warm.  Neurological:     General: No focal deficit present.     Mental Status: She is alert and oriented to person, place, and time. Mental status is at baseline.  Psychiatric:        Mood and Affect: Mood normal.        Behavior: Behavior normal.        Thought Content: Thought content normal.     Assessment/Plan: 1. Mixed hyperlipidemia Discussed her abnormal lipid panel--discussed lifestyle modifications such as improving her eating habits and incorporating exercise into her routine  2. Generalized anxiety disorder Discussed safe taper  in order to wean herself off of Lexapro Will continue with monitoring  3. BMI 34.0-34.9,adult Discussed the importance of weight management through healthy eating and daily exercise as tolerated. Discussed the negative effects obesity has on pulmonary health, cardiac health as well as overall general health and well being.   General Counseling: Davita verbalizes understanding of the findings of todays visit and agrees  with plan of treatment. I have discussed any further diagnostic evaluation that may be needed or ordered today. We also reviewed her medications today. she has been encouraged to call the office with any questions or concerns that should arise related to todays visit.   Time spent: 30 Minutes Time spent includes review of chart, medications, test results and follow-up plan with the patient.  This patient was seen by Theodoro Grist AGNP-C in Collaboration with Dr Lavera Guise as a part of collaborative care agreement     Tanna Furry. Javares Kaufhold AGNP-C Internal medicine

## 2020-10-29 ENCOUNTER — Encounter: Payer: Self-pay | Admitting: Hospice and Palliative Medicine

## 2020-10-31 LAB — LIPID PANEL WITH LDL/HDL RATIO
Cholesterol, Total: 234 mg/dL — ABNORMAL HIGH (ref 100–199)
HDL: 68 mg/dL (ref >39)
LDL Chol Calc (NIH): 126 mg/dL — ABNORMAL HIGH (ref 0–99)
LDL/HDL Ratio: 1.9 ratio (ref 0.0–3.2)
Triglycerides: 229 mg/dL — ABNORMAL HIGH (ref 0–149)
VLDL Cholesterol Cal: 40 mg/dL (ref 5–40)

## 2020-10-31 LAB — CBC WITH DIFFERENTIAL/PLATELET
Basophils Absolute: 0 10*3/uL (ref 0.0–0.2)
Basos: 1 %
EOS (ABSOLUTE): 0.1 10*3/uL (ref 0.0–0.4)
Eos: 2 %
Hematocrit: 40 % (ref 34.0–46.6)
Hemoglobin: 13 g/dL (ref 11.1–15.9)
Immature Grans (Abs): 0.1 10*3/uL (ref 0.0–0.1)
Immature Granulocytes: 1 %
Lymphocytes Absolute: 1.6 10*3/uL (ref 0.7–3.1)
Lymphs: 21 %
MCH: 28.2 pg (ref 26.6–33.0)
MCHC: 32.5 g/dL (ref 31.5–35.7)
MCV: 87 fL (ref 79–97)
Monocytes Absolute: 0.4 10*3/uL (ref 0.1–0.9)
Monocytes: 6 %
Neutrophils Absolute: 5.5 10*3/uL (ref 1.4–7.0)
Neutrophils: 69 %
Platelets: 302 10*3/uL (ref 150–450)
RBC: 4.61 x10E6/uL (ref 3.77–5.28)
RDW: 12 % (ref 11.7–15.4)
WBC: 7.8 10*3/uL (ref 3.4–10.8)

## 2020-10-31 LAB — COMPREHENSIVE METABOLIC PANEL
ALT: 16 IU/L (ref 0–32)
AST: 13 IU/L (ref 0–40)
Albumin/Globulin Ratio: 1.5 (ref 1.2–2.2)
Albumin: 4.4 g/dL (ref 3.8–4.8)
Alkaline Phosphatase: 89 IU/L (ref 44–121)
BUN/Creatinine Ratio: 14 (ref 9–23)
BUN: 10 mg/dL (ref 6–24)
Bilirubin Total: 0.4 mg/dL (ref 0.0–1.2)
CO2: 21 mmol/L (ref 20–29)
Calcium: 8.8 mg/dL (ref 8.7–10.2)
Chloride: 102 mmol/L (ref 96–106)
Creatinine, Ser: 0.72 mg/dL (ref 0.57–1.00)
GFR calc Af Amer: 120 mL/min/{1.73_m2} (ref >59)
GFR calc non Af Amer: 104 mL/min/{1.73_m2} (ref >59)
Globulin, Total: 2.9 g/dL (ref 1.5–4.5)
Glucose: 95 mg/dL (ref 65–99)
Potassium: 4.6 mmol/L (ref 3.5–5.2)
Sodium: 137 mmol/L (ref 134–144)
Total Protein: 7.3 g/dL (ref 6.0–8.5)

## 2020-10-31 LAB — VITAMIN D 1,25 DIHYDROXY
Vitamin D 1, 25 (OH)2 Total: 46 pg/mL
Vitamin D2 1, 25 (OH)2: <10 pg/mL
Vitamin D3 1, 25 (OH)2: 46 pg/mL

## 2020-10-31 LAB — TSH+FREE T4
Free T4: 1.05 ng/dL (ref 0.82–1.77)
TSH: 2.52 u[IU]/mL (ref 0.450–4.500)

## 2020-10-31 LAB — VITAMIN B12: Vitamin B-12: 306 pg/mL (ref 232–1245)

## 2020-10-31 NOTE — Progress Notes (Signed)
Labs discussed at her visit.

## 2021-02-02 ENCOUNTER — Other Ambulatory Visit: Payer: Self-pay | Admitting: Hospice and Palliative Medicine

## 2021-02-02 DIAGNOSIS — Z0001 Encounter for general adult medical examination with abnormal findings: Secondary | ICD-10-CM

## 2021-02-02 DIAGNOSIS — R300 Dysuria: Secondary | ICD-10-CM

## 2021-02-02 DIAGNOSIS — Z1231 Encounter for screening mammogram for malignant neoplasm of breast: Secondary | ICD-10-CM

## 2021-02-02 DIAGNOSIS — F411 Generalized anxiety disorder: Secondary | ICD-10-CM

## 2021-02-02 DIAGNOSIS — Z23 Encounter for immunization: Secondary | ICD-10-CM

## 2021-05-20 ENCOUNTER — Ambulatory Visit: Payer: BC Managed Care – PPO | Admitting: Nurse Practitioner

## 2021-06-11 ENCOUNTER — Telehealth: Payer: Self-pay

## 2021-06-11 NOTE — Telephone Encounter (Signed)
Left vm to screen for 06/12/21 appointment-Toni

## 2021-06-12 ENCOUNTER — Other Ambulatory Visit: Payer: Self-pay

## 2021-06-12 ENCOUNTER — Encounter: Payer: Self-pay | Admitting: Internal Medicine

## 2021-06-12 ENCOUNTER — Ambulatory Visit (INDEPENDENT_AMBULATORY_CARE_PROVIDER_SITE_OTHER): Payer: BC Managed Care – PPO | Admitting: Internal Medicine

## 2021-06-12 VITALS — BP 136/86 | HR 101 | Temp 98.3°F | Resp 16 | Ht 64.0 in | Wt 214.8 lb

## 2021-06-12 DIAGNOSIS — R5383 Other fatigue: Secondary | ICD-10-CM | POA: Diagnosis not present

## 2021-06-12 DIAGNOSIS — E782 Mixed hyperlipidemia: Secondary | ICD-10-CM | POA: Diagnosis not present

## 2021-06-12 DIAGNOSIS — Z6836 Body mass index (BMI) 36.0-36.9, adult: Secondary | ICD-10-CM

## 2021-06-12 DIAGNOSIS — F411 Generalized anxiety disorder: Secondary | ICD-10-CM

## 2021-06-12 NOTE — Progress Notes (Signed)
Follow up.

## 2021-06-12 NOTE — Progress Notes (Signed)
[facility] [place], [place] [address]  Internal MEDICINE  Office Visit Note  Patient Name: Sophia Garrett  [id number]  [id number]  Date of Service: 06/19/2021  Chief Complaint  Patient presents with   Follow-up   Hyperlipidemia    HPI Patient is here for routine follow-up. Patient has history of generalized anxiety with depressive symptoms and has been on Lexapro she feels well on it. Patient also has hyperlipidemia almost 6 months ago she has not made any major changes in her diet however we will wait until her repeat labs to adjust any medications. Patient is also concerned about her weight, does make poor dietary selections. Does not exercise regularly    Current Medication: Outpatient Encounter Medications as of 06/12/2021  Medication Sig   cetirizine (ZYRTEC) 10 MG tablet Take 10 mg by mouth daily.   escitalopram (LEXAPRO) 10 MG tablet TAKE 1 AND 1/2 TABLETS DAILY BY MOUTH   No facility-administered encounter medications on file as of 06/12/2021.    Surgical History: History reviewed. No pertinent surgical history.  Medical History: Past Medical History:  Diagnosis Date   Allergy    Anxiety     Family History: Family History  Problem Relation Age of Onset   Hyperlipidemia Mother    Skin cancer Mother        'not melanoma'   Alcohol abuse Father    Heart disease Father    Hypertension Father    Cancer Maternal Aunt        Breast Cancer   Cancer Paternal Aunt        Breast Cancer   Cancer Maternal Grandmother        Breast Cancer   Cancer Maternal Grandfather        Prostate/Lung Cancer   Cancer Paternal Grandmother        Breast Cancer   Cancer Paternal Grandfather        Prostate Cancer   Cancer Paternal Aunt        Breast Cancer   Colon cancer Neg Hx     Social History   Socioeconomic History   Marital status: Married    Spouse name: Not on file   Number of children: Not on file   Years of education: Not on file    Highest education level: Not on file  Occupational History   Not on file  Tobacco Use   Smoking status: Never   Smokeless tobacco: Never  Vaping Use   Vaping Use: Never used  Substance and Sexual Activity   Alcohol use: Yes    Alcohol/week: 0.0 standard drinks    Comment: Occasionaly    Drug use: No   Sexual activity: Yes    Partners: Male    Birth control/protection: None    Comment: Husband  Other Topics Concern   Not on file  Social History Narrative   Married   Masters Degree   Caffeine- Soda diet Dr. Malachi Bonds 3 cans daily, no coffee    Children- 3    Pets: 1 dog     Social Determinants of Health   Financial Resource Strain: Not on file  Food Insecurity: Not on file  Transportation Needs: Not on file  Physical Activity: Not on file  Stress: Not on file  Social Connections: Not on file  Intimate Partner Violence: Not on file      Review of Systems  Constitutional:  Negative for chills, fatigue and unexpected weight change.  HENT:  Positive for  postnasal drip. Negative for congestion, rhinorrhea, sneezing and sore throat.   Eyes:  Negative for redness.  Respiratory:  Negative for cough, chest tightness and shortness of breath.   Cardiovascular:  Negative for chest pain and palpitations.  Gastrointestinal:  Negative for abdominal pain, constipation, diarrhea, nausea and vomiting.  Genitourinary:  Negative for dysuria and frequency.  Musculoskeletal:  Negative for arthralgias, back pain, joint swelling and neck pain.  Skin:  Negative for rash.  Neurological: Negative.  Negative for tremors and numbness.  Hematological:  Negative for adenopathy. Does not bruise/bleed easily.  Psychiatric/Behavioral:  Negative for behavioral problems (Depression), sleep disturbance and suicidal ideas. The patient is not nervous/anxious.    Vital Signs: BP 136/86   Pulse (!) 101   Temp 98.3 F (36.8 C)   Resp 16   Ht 5\' 4"  (1.626 m)   Wt 214 lb 12.8 oz (97.4 kg)   SpO2 97%    BMI 36.87 kg/m    Physical Exam Constitutional:      Appearance: Normal appearance.  HENT:     Head: Normocephalic and atraumatic.     Nose: Nose normal.     Mouth/Throat:     Mouth: Mucous membranes are moist.     Pharynx: No posterior oropharyngeal erythema.  Eyes:     Extraocular Movements: Extraocular movements intact.     Pupils: Pupils are equal, round, and reactive to light.  Cardiovascular:     Rate and Rhythm: Normal rate and regular rhythm.     Pulses: Normal pulses.     Heart sounds: Normal heart sounds.  Pulmonary:     Effort: Pulmonary effort is normal.     Breath sounds: Normal breath sounds.  Neurological:     General: No focal deficit present.     Mental Status: She is alert.  Psychiatric:        Mood and Affect: Mood normal.        Behavior: Behavior normal.       Assessment/Plan: 1. Mixed hyperlipidemia Patient will look further into her diet and make proper selections in her foods patient is able to eat a lot of green vegetables avoid red meat if possible  2. Generalized anxiety disorder Continue Lexapro as before  3. BMI 36.0-36.9,adult Discussion about weight management options, might want to start the weight loss program here, will get metabolic Test Obesity Counseling: Risk Assessment: An assessment of behavioral risk factors was made today and includes lack of exercise sedentary lifestyle, lack of portion control and poor dietary habits.  Risk Modification Advice: She was counseled on portion control guidelines. Restricting daily caloric intake to 1500. The detrimental long term effects of obesity on her health and ongoing poor compliance was also discussed with the patient.   General Counseling: Min verbalizes understanding of the findings of todays visit and agrees with plan of treatment. I have discussed any further diagnostic evaluation that may be needed or ordered today. We also reviewed her medications today. she has been encouraged  to call the office with any questions or concerns that should arise related to todays visit.  Labs are ordered today for her physical appointment in few months  Orders Placed This Encounter  Procedures   CBC with Differential/Platelet   Lipid Panel With LDL/HDL Ratio   TSH   T4, free   Comprehensive metabolic panel    No orders of the defined types were placed in this encounter.   Total time spent:30 Minutes Time spent includes review of chart,  medications, test results, and follow up plan with the patient.   [place] Controlled Substance Database was reviewed by me.   Dr Lavera Guise Internal medicine

## 2021-07-09 ENCOUNTER — Other Ambulatory Visit: Payer: Self-pay

## 2021-07-09 DIAGNOSIS — R300 Dysuria: Secondary | ICD-10-CM

## 2021-07-09 DIAGNOSIS — Z0001 Encounter for general adult medical examination with abnormal findings: Secondary | ICD-10-CM

## 2021-07-09 DIAGNOSIS — Z23 Encounter for immunization: Secondary | ICD-10-CM

## 2021-07-09 DIAGNOSIS — F411 Generalized anxiety disorder: Secondary | ICD-10-CM

## 2021-07-09 DIAGNOSIS — Z1231 Encounter for screening mammogram for malignant neoplasm of breast: Secondary | ICD-10-CM

## 2021-07-09 MED ORDER — ESCITALOPRAM OXALATE 10 MG PO TABS: ORAL_TABLET | ORAL | 3 refills | Status: DC

## 2021-08-26 ENCOUNTER — Other Ambulatory Visit: Payer: Self-pay | Admitting: Nurse Practitioner

## 2021-09-23 ENCOUNTER — Telehealth: Payer: Self-pay

## 2021-09-23 NOTE — Telephone Encounter (Signed)
Left vm to confirm 09/24/21 appointment-Toni

## 2021-09-24 ENCOUNTER — Encounter: Payer: Self-pay | Admitting: Nurse Practitioner

## 2021-09-24 ENCOUNTER — Ambulatory Visit (INDEPENDENT_AMBULATORY_CARE_PROVIDER_SITE_OTHER): Payer: BC Managed Care – PPO | Admitting: Nurse Practitioner

## 2021-09-24 ENCOUNTER — Other Ambulatory Visit: Payer: Self-pay

## 2021-09-24 VITALS — BP 122/82 | HR 98 | Temp 98.7°F | Resp 16 | Ht 65.0 in | Wt 215.4 lb

## 2021-09-24 DIAGNOSIS — Z23 Encounter for immunization: Secondary | ICD-10-CM | POA: Diagnosis not present

## 2021-09-24 DIAGNOSIS — E559 Vitamin D deficiency, unspecified: Secondary | ICD-10-CM

## 2021-09-24 DIAGNOSIS — F40243 Fear of flying: Secondary | ICD-10-CM | POA: Diagnosis not present

## 2021-09-24 DIAGNOSIS — Z0001 Encounter for general adult medical examination with abnormal findings: Secondary | ICD-10-CM

## 2021-09-24 DIAGNOSIS — E782 Mixed hyperlipidemia: Secondary | ICD-10-CM

## 2021-09-24 DIAGNOSIS — Z6835 Body mass index (BMI) 35.0-35.9, adult: Secondary | ICD-10-CM

## 2021-09-24 DIAGNOSIS — R300 Dysuria: Secondary | ICD-10-CM

## 2021-09-24 DIAGNOSIS — F411 Generalized anxiety disorder: Secondary | ICD-10-CM | POA: Diagnosis not present

## 2021-09-24 DIAGNOSIS — E661 Drug-induced obesity: Secondary | ICD-10-CM

## 2021-09-24 DIAGNOSIS — I10 Essential (primary) hypertension: Secondary | ICD-10-CM

## 2021-09-24 DIAGNOSIS — Z1231 Encounter for screening mammogram for malignant neoplasm of breast: Secondary | ICD-10-CM

## 2021-09-24 MED ORDER — ALPRAZOLAM 0.25 MG PO TABS: 0.2500 mg | ORAL_TABLET | Freq: Once | ORAL | 0 refills | Status: DC | PRN

## 2021-09-24 NOTE — Progress Notes (Signed)
[facility] [place], [place] [address]  Internal MEDICINE  Office Visit Note  Patient Name: Sophia Garrett  [id number]  [id number]  Date of Service: 09/24/2021  Chief Complaint  Patient presents with   Annual Exam   Anxiety    HPI Katura presents for an annual well visit and physical exam. She is a well-appearing 40 yo female. She has anxiety and hyperlipidemia. She was seen by Dr. Clayborn Bigness in June this year. Her anxiety continues to be well-managed with lexapro. Her prior labs in November 2021 do indicate that she has significant hyperlipidemia. Diet and lifestyle modifications have been discussed in detail on previous office visits. As of June, she reported that her diet and exercise were still poor. Today, she has gained only 1 lbs since her office visit in June. She is more active now with being back at school. 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 She is trying to eat healthy foods and limiting high sugar and high carb foods.  She has lab orders that have not been drawn yet. Patient was reminded to get labs drawn.    Current Medication: Outpatient Encounter Medications as of 09/24/2021  Medication Sig   ALPRAZolam (XANAX) 0.25 MG tablet Take 1 tablet (0.25 mg total) by mouth once as needed for up to 1 dose for anxiety (before a flight).   cetirizine (ZYRTEC) 10 MG tablet Take 10 mg by mouth daily.   escitalopram (LEXAPRO) 10 MG tablet TAKE 1 AND 1/2 TABLETS DAILY BY MOUTH   No facility-administered encounter medications on file as of 09/24/2021.    Surgical History: Past Surgical History:  Procedure Laterality Date   MOLE REMOVAL Right    right side of chest    Medical History: Past Medical History:  Diagnosis Date   Allergy    Anxiety     Family History: Family History  Problem Relation Age of Onset   Hyperlipidemia Mother    Skin cancer Mother        'not melanoma'    Alcohol abuse Father    Heart disease Father    Hypertension Father    Arrhythmia Sister    Cancer Maternal Aunt        Breast Cancer   Cancer Paternal Aunt        Breast Cancer   Cancer Paternal Aunt        Breast Cancer   Cancer Maternal Grandmother        Breast Cancer   Cancer Maternal Grandfather        Prostate/Lung Cancer   Cancer Paternal Grandmother        Breast Cancer   Cancer Paternal Grandfather        Prostate Cancer   Colon cancer Neg Hx     Social History   Socioeconomic History   Marital status: Married    Spouse name: Not on file   Number of children: Not on file   Years of education: Not on file   Highest education level: Not on file  Occupational History   Not on file  Tobacco Use   Smoking status: Never   Smokeless tobacco: Never  Vaping Use   Vaping Use: Never used  Substance and Sexual Activity   Alcohol use: Yes    Alcohol/week: 0.0 standard drinks    Comment: Occasionaly    Drug use: No   Sexual activity: Yes    Partners: Male  Birth control/protection: None    Comment: Husband  Other Topics Concern   Not on file  Social History Narrative   Married   Masters Degree   Caffeine- Soda diet Dr. Malachi Bonds 3 cans daily, no coffee    Children- 3    Pets: 1 dog     Social Determinants of Health   Financial Resource Strain: Not on file  Food Insecurity: Not on file  Transportation Needs: Not on file  Physical Activity: Not on file  Stress: Not on file  Social Connections: Not on file  Intimate Partner Violence: Not on file      Review of Systems  Constitutional:  Negative for activity change, appetite change, chills, fatigue, fever and unexpected weight change.  HENT: Negative.  Negative for congestion, ear pain, rhinorrhea, sore throat and trouble swallowing.   Eyes: Negative.   Respiratory: Negative.  Negative for cough, chest tightness, shortness of breath and wheezing.   Cardiovascular: Negative.  Negative for chest pain and  palpitations.  Gastrointestinal: Negative.  Negative for abdominal pain, blood in stool, constipation, diarrhea, nausea and vomiting.  Endocrine: Negative.   Genitourinary: Negative.  Negative for difficulty urinating, dysuria, frequency, hematuria and urgency.  Musculoskeletal: Negative.  Negative for arthralgias, back pain, joint swelling, myalgias and neck pain.  Skin: Negative.  Negative for rash and wound.  Allergic/Immunologic: Negative.  Negative for immunocompromised state.  Neurological: Negative.  Negative for dizziness, seizures, numbness and headaches.  Hematological: Negative.   Psychiatric/Behavioral: Negative.  Negative for behavioral problems, self-injury and suicidal ideas. The patient is not nervous/anxious.    Vital Signs: BP 122/82   Pulse 98   Temp 98.7 F (37.1 C)   Resp 16   Ht 5\' 5"  (1.651 m)   Wt 215 lb 6.4 oz (97.7 kg)   SpO2 98%   BMI 35.84 kg/m    Physical Exam Vitals reviewed.  Constitutional:      General: She is awake. She is not in acute distress.    Appearance: Normal appearance. She is well-developed and well-groomed. She is obese. She is not ill-appearing or diaphoretic.  HENT:     Head: Normocephalic and atraumatic.     Right Ear: Tympanic membrane, ear canal and external ear normal.     Left Ear: Tympanic membrane, ear canal and external ear normal.     Nose: Nose normal. No congestion or rhinorrhea.     Mouth/Throat:     Lips: Pink.     Mouth: Mucous membranes are moist.     Pharynx: Oropharynx is clear. Uvula midline. No oropharyngeal exudate or posterior oropharyngeal erythema.  Eyes:     General: Lids are normal. Vision grossly intact. Gaze aligned appropriately. No scleral icterus.       Right eye: No discharge.        Left eye: No discharge.     Extraocular Movements: Extraocular movements intact.     Conjunctiva/sclera: Conjunctivae normal.     Pupils: Pupils are equal, round, and reactive to light.     Funduscopic exam:     Right eye: Red reflex present.        Left eye: Red reflex present. Neck:     Thyroid: No thyromegaly.     Vascular: No carotid bruit or JVD.     Trachea: Trachea and phonation normal. No tracheal deviation.  Cardiovascular:     Rate and Rhythm: Normal rate and regular rhythm.     Pulses: Normal pulses.     Heart sounds:  Normal heart sounds, S1 normal and S2 normal. No murmur heard.   No friction rub. No gallop.  Pulmonary:     Effort: Pulmonary effort is normal. No accessory muscle usage or respiratory distress.     Breath sounds: Normal breath sounds and air entry. No stridor. No wheezing or rales.  Chest:     Chest wall: No tenderness.     Comments: Declined clinical breast exam, gets yearly mammograms. Abdominal:     General: Bowel sounds are normal. There is no distension.     Palpations: Abdomen is soft. There is no shifting dullness, fluid wave, mass or pulsatile mass.     Tenderness: There is no abdominal tenderness. There is no guarding or rebound.  Musculoskeletal:        General: No tenderness or deformity. Normal range of motion.     Cervical back: Normal range of motion and neck supple.     Right lower leg: No edema.     Left lower leg: No edema.  Lymphadenopathy:     Cervical: No cervical adenopathy.  Skin:    General: Skin is warm and dry.     Capillary Refill: Capillary refill takes less than 2 seconds.     Coloration: Skin is not pale.     Findings: No erythema or rash.  Neurological:     Mental Status: She is alert and oriented to person, place, and time.     Cranial Nerves: No cranial nerve deficit.     Motor: No abnormal muscle tone.     Coordination: Coordination normal.     Gait: Gait normal.     Deep Tendon Reflexes: Reflexes are normal and symmetric.  Psychiatric:        Mood and Affect: Mood and affect normal.        Behavior: Behavior normal. Behavior is cooperative.        Thought Content: Thought content normal.        Judgment: Judgment normal.        Assessment/Plan: 1. Encounter for routine adult health examination with abnormal findings Age-appropriate preventive screenings and vaccinations discussed, annual physical exam completed. Routine labs for health maintenance were previously ordered, patient was reminded to have the labs drawn and she will be called with results. . PHM updated.   2. Mixed hyperlipidemia Currently focusing on diet and lifestyle modifications reminded to have fasting labs drawn which includes a repeat lipid profile. Not on statin therapy. Will reevaluate need for statin thereapy when the patient is 40 yo.   3. Generalized anxiety disorder Stable with lexapro.   4. Anxiety with flying Will be traveling soon and flying on a nonstop roundtrip flight, has very bad anxiety with flying and is requesting alprazolam for the flight  there and the return flight.  - ALPRAZolam (XANAX) 0.25 MG tablet; Take 1 tablet (0.25 mg total) by mouth once as needed for up to 1 dose for anxiety (before a flight).  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 Patient is in the process of incorporating healthy modifications into her daily life.   6. Encounter for screening mammogram for malignant neoplasm of breast Mammogram ordered, patient will call to schedule.  - MM Digital Screening; Future  7. Needs flu shot Administered in office today - Flu Vaccine MDCK  QUAD PF  8. Dysuria Routine urinalysis done.  - UA/M w/rflx Culture, Routine - Microscopic Examination      General Counseling: Denishia verbalizes understanding of the findings of todays visit and agrees with plan of treatment. I have discussed any further diagnostic evaluation that may be needed or ordered today. We also reviewed her medications today.  she has been encouraged to call the office with any questions or concerns that should arise related to todays visit.    Orders Placed This Encounter  Procedures   Microscopic Examination   MM Digital Screening   Flu Vaccine MDCK QUAD PF   UA/M w/rflx Culture, Routine    Meds ordered this encounter  Medications   ALPRAZolam (XANAX) 0.25 MG tablet    Sig: Take 1 tablet (0.25 mg total) by mouth once as needed for up to 1 dose for anxiety (before a flight).    Dispense:  2 tablet    Refill:  0    Return in about 1 year (around 09/24/2022) for CPE, [place] PCP.   Total time spent:30 Minutes Time spent includes review of chart, medications, test results, and follow up plan with the patient.   [place] Controlled Substance Database was reviewed by me.  This patient was seen by Jonetta Osgood, FNP-C in collaboration with Dr. Clayborn Bigness as a part of collaborative care agreement.  Ferdinando Lodge R. Valetta Fuller, MSN, FNP-C Internal medicine

## 2021-09-25 LAB — UA/M W/RFLX CULTURE, ROUTINE
Bilirubin, UA: NEGATIVE
Glucose, UA: NEGATIVE
Ketones, UA: NEGATIVE
Leukocytes,UA: NEGATIVE
Nitrite, UA: NEGATIVE
Protein,UA: NEGATIVE
RBC, UA: NEGATIVE
Specific Gravity, UA: 1.007 (ref 1.005–1.030)
Urobilinogen, Ur: 0.2 mg/dL (ref 0.2–1.0)
pH, UA: 6 (ref 5.0–7.5)

## 2021-09-25 LAB — MICROSCOPIC EXAMINATION
Bacteria, UA: NONE SEEN
Casts: NONE SEEN /lpf
Epithelial Cells (non renal): NONE SEEN /hpf (ref 0–10)
RBC, Urine: NONE SEEN /hpf (ref 0–2)
WBC, UA: NONE SEEN /hpf (ref 0–5)

## 2021-11-13 ENCOUNTER — Telehealth: Payer: Self-pay

## 2021-11-13 DIAGNOSIS — F40243 Fear of flying: Secondary | ICD-10-CM

## 2021-11-14 MED ORDER — ALPRAZOLAM 0.25 MG PO TABS: 0.2500 mg | ORAL_TABLET | Freq: Once | ORAL | 0 refills | Status: DC | PRN

## 2021-11-14 NOTE — Telephone Encounter (Signed)
Is going on flight to [place], needs another refill on alprazolam which she only takes for flights.

## 2021-11-14 NOTE — Telephone Encounter (Signed)
Med sent to pharmacy.

## 2021-12-01 ENCOUNTER — Other Ambulatory Visit: Payer: Self-pay | Admitting: Internal Medicine

## 2021-12-01 DIAGNOSIS — F411 Generalized anxiety disorder: Secondary | ICD-10-CM

## 2021-12-01 DIAGNOSIS — Z0001 Encounter for general adult medical examination with abnormal findings: Secondary | ICD-10-CM

## 2021-12-01 DIAGNOSIS — R300 Dysuria: Secondary | ICD-10-CM

## 2021-12-01 DIAGNOSIS — Z23 Encounter for immunization: Secondary | ICD-10-CM

## 2021-12-01 DIAGNOSIS — Z1231 Encounter for screening mammogram for malignant neoplasm of breast: Secondary | ICD-10-CM

## 2022-08-14 ENCOUNTER — Telehealth: Payer: Self-pay | Admitting: Nurse Practitioner

## 2022-08-14 NOTE — Telephone Encounter (Signed)
Left vm notifying patient of 09/14/22 appt date change-Toni

## 2022-08-20 ENCOUNTER — Other Ambulatory Visit: Payer: Self-pay | Admitting: Nurse Practitioner

## 2022-08-20 ENCOUNTER — Other Ambulatory Visit: Payer: Self-pay | Admitting: Internal Medicine

## 2022-08-20 DIAGNOSIS — Z1231 Encounter for screening mammogram for malignant neoplasm of breast: Secondary | ICD-10-CM

## 2022-09-10 ENCOUNTER — Ambulatory Visit
Admission: RE | Admit: 2022-09-10 | Discharge: 2022-09-10 | Disposition: A | Discharge status: Home / Self Care | Payer: BC Managed Care – PPO | Source: Ambulatory Visit | Attending: Internal Medicine | Admitting: Internal Medicine

## 2022-09-10 DIAGNOSIS — Z1231 Encounter for screening mammogram for malignant neoplasm of breast: Secondary | ICD-10-CM | POA: Diagnosis not present

## 2022-09-11 ENCOUNTER — Other Ambulatory Visit: Payer: Self-pay | Admitting: Internal Medicine

## 2022-09-11 DIAGNOSIS — N6489 Other specified disorders of breast: Secondary | ICD-10-CM

## 2022-09-11 DIAGNOSIS — R928 Other abnormal and inconclusive findings on diagnostic imaging of breast: Secondary | ICD-10-CM

## 2022-09-11 DIAGNOSIS — R921 Mammographic calcification found on diagnostic imaging of breast: Secondary | ICD-10-CM

## 2022-09-14 ENCOUNTER — Encounter: Payer: BC Managed Care – PPO | Admitting: Nurse Practitioner

## 2022-09-29 ENCOUNTER — Encounter: Payer: Self-pay | Admitting: Nurse Practitioner

## 2022-09-29 ENCOUNTER — Ambulatory Visit (INDEPENDENT_AMBULATORY_CARE_PROVIDER_SITE_OTHER): Payer: BC Managed Care – PPO | Admitting: Nurse Practitioner

## 2022-09-29 VITALS — BP 134/85 | HR 96 | Temp 98.0°F | Resp 16 | Ht 65.0 in | Wt 212.0 lb

## 2022-09-29 DIAGNOSIS — Z1231 Encounter for screening mammogram for malignant neoplasm of breast: Secondary | ICD-10-CM

## 2022-09-29 DIAGNOSIS — Z760 Encounter for issue of repeat prescription: Secondary | ICD-10-CM

## 2022-09-29 DIAGNOSIS — E538 Deficiency of other specified B group vitamins: Secondary | ICD-10-CM

## 2022-09-29 DIAGNOSIS — F411 Generalized anxiety disorder: Secondary | ICD-10-CM

## 2022-09-29 DIAGNOSIS — Z0001 Encounter for general adult medical examination with abnormal findings: Secondary | ICD-10-CM

## 2022-09-29 DIAGNOSIS — R300 Dysuria: Secondary | ICD-10-CM

## 2022-09-29 DIAGNOSIS — E782 Mixed hyperlipidemia: Secondary | ICD-10-CM

## 2022-09-29 DIAGNOSIS — E559 Vitamin D deficiency, unspecified: Secondary | ICD-10-CM | POA: Diagnosis not present

## 2022-09-29 DIAGNOSIS — F40243 Fear of flying: Secondary | ICD-10-CM

## 2022-09-29 MED ORDER — ALPRAZOLAM 0.25 MG PO TABS: 0.2500 mg | ORAL_TABLET | Freq: Once | ORAL | 0 refills | Status: DC | PRN

## 2022-09-29 MED ORDER — ESCITALOPRAM OXALATE 10 MG PO TABS: ORAL_TABLET | ORAL | 3 refills | Status: DC

## 2022-09-29 NOTE — Progress Notes (Signed)
[facility] [place], [place] [address]  Internal MEDICINE  Office Visit Note  Patient Name: Sophia Garrett  [id number]  [id number]  Date of Service: 09/29/2022  Chief Complaint  Patient presents with   Annual Exam   Anxiety    HPI Sophia Garrett presents for an annual well visit and physical exam.  Well-appearing 41 year old female with GAD and multiple allergies.  Lost 3 lbs since last visit.  Requests alprazolam 0.25 mg x1-2 doses for anxiety with flying -- has a trip coming up with 2 flights.  Takes lexapro for GAD as well.  Due for mammogram --gets done through her OBGYN, as well as pap Due for routine labs    Current Medication: Outpatient Encounter Medications as of 09/29/2022  Medication Sig   cetirizine (ZYRTEC) 10 MG tablet Take 10 mg by mouth daily.   [DISCONTINUED] ALPRAZolam (XANAX) 0.25 MG tablet Take 1 tablet (0.25 mg total) by mouth once as needed for up to 1 dose for anxiety (before a flight).   [DISCONTINUED] escitalopram (LEXAPRO) 10 MG tablet TAKE 1 AND 1/2 TABLETS BY MOUTH DAILY   ALPRAZolam (XANAX) 0.25 MG tablet Take 1 tablet (0.25 mg total) by mouth once as needed for up to 1 dose for anxiety (before a flight).   escitalopram (LEXAPRO) 10 MG tablet TAKE 1 AND 1/2 TABLETS BY MOUTH DAILY   No facility-administered encounter medications on file as of 09/29/2022.    Surgical History: Past Surgical History:  Procedure Laterality Date   MOLE REMOVAL Right    right side of chest    Medical History: Past Medical History:  Diagnosis Date   Allergy    Anxiety     Family History: Family History  Problem Relation Age of Onset   Hyperlipidemia Mother    Skin cancer Mother        'not melanoma'   Alcohol abuse Father    Heart disease Father    Hypertension Father    Arrhythmia Sister    Cancer Maternal Aunt        Breast Cancer   Cancer Paternal Aunt        Breast Cancer   Cancer Paternal Aunt        Breast Cancer    Cancer Maternal Grandmother        Breast Cancer   Cancer Maternal Grandfather        Prostate/Lung Cancer   Cancer Paternal Grandmother        Breast Cancer   Cancer Paternal Grandfather        Prostate Cancer   Colon cancer Neg Hx     Social History   Socioeconomic History   Marital status: Married    Spouse name: Not on file   Number of children: Not on file   Years of education: Not on file   Highest education level: Not on file  Occupational History   Not on file  Tobacco Use   Smoking status: Never   Smokeless tobacco: Never  Vaping Use   Vaping Use: Never used  Substance and Sexual Activity   Alcohol use: Yes    Alcohol/week: 0.0 standard drinks of alcohol    Comment: Occasionaly    Drug use: No   Sexual activity: Yes    Partners: Male    Birth control/protection: None    Comment: Husband  Other Topics Concern   Not on file  Social History Narrative   Married   Masters Degree  Caffeine- Soda diet Dr. Malachi Bonds 3 cans daily, no coffee    Children- 3    Pets: 1 dog     Social Determinants of Health   Financial Resource Strain: Not on file  Food Insecurity: Not on file  Transportation Needs: Not on file  Physical Activity: Not on file  Stress: Not on file  Social Connections: Not on file  Intimate Partner Violence: Not on file      Review of Systems  Constitutional:  Negative for activity change, appetite change, chills, fatigue, fever and unexpected weight change.  HENT: Negative.  Negative for congestion, ear pain, rhinorrhea, sore throat and trouble swallowing.   Eyes: Negative.   Respiratory: Negative.  Negative for cough, chest tightness, shortness of breath and wheezing.   Cardiovascular: Negative.  Negative for chest pain and palpitations.  Gastrointestinal: Negative.  Negative for abdominal pain, blood in stool, constipation, diarrhea, nausea and vomiting.  Endocrine: Negative.   Genitourinary: Negative.  Negative for difficulty urinating,  dysuria, frequency, hematuria and urgency.  Musculoskeletal: Negative.  Negative for arthralgias, back pain, joint swelling, myalgias and neck pain.  Skin: Negative.  Negative for rash and wound.  Allergic/Immunologic: Negative.  Negative for immunocompromised state.  Neurological: Negative.  Negative for dizziness, seizures, numbness and headaches.  Hematological: Negative.   Psychiatric/Behavioral: Negative.  Negative for behavioral problems, self-injury and suicidal ideas. The patient is not nervous/anxious.     Vital Signs: BP 134/85   Pulse 96   Temp 98 F (36.7 C)   Resp 16   Ht 5' 5"  (1.651 m)   Wt 212 lb (96.2 kg)   LMP 08/11/2022   SpO2 99%   BMI 35.28 kg/m    Physical Exam Vitals reviewed.  Constitutional:      General: She is awake. She is not in acute distress.    Appearance: Normal appearance. She is well-developed and well-groomed. She is obese. She is not ill-appearing or diaphoretic.  HENT:     Head: Normocephalic and atraumatic.     Right Ear: Tympanic membrane, ear canal and external ear normal.     Left Ear: Tympanic membrane, ear canal and external ear normal.     Nose: Nose normal. No congestion or rhinorrhea.     Mouth/Throat:     Lips: Pink.     Mouth: Mucous membranes are moist.     Pharynx: Oropharynx is clear. Uvula midline. No oropharyngeal exudate or posterior oropharyngeal erythema.  Eyes:     General: Lids are normal. Vision grossly intact. Gaze aligned appropriately. No scleral icterus.       Right eye: No discharge.        Left eye: No discharge.     Extraocular Movements: Extraocular movements intact.     Conjunctiva/sclera: Conjunctivae normal.     Pupils: Pupils are equal, round, and reactive to light.     Funduscopic exam:    Right eye: Red reflex present.        Left eye: Red reflex present. Neck:     Thyroid: No thyromegaly.     Vascular: No carotid bruit or JVD.     Trachea: Trachea and phonation normal. No tracheal deviation.   Cardiovascular:     Rate and Rhythm: Normal rate and regular rhythm.     Pulses: Normal pulses.     Heart sounds: Normal heart sounds, S1 normal and S2 normal. No murmur heard.    No friction rub. No gallop.  Pulmonary:     Effort: Pulmonary  effort is normal. No accessory muscle usage or respiratory distress.     Breath sounds: Normal breath sounds and air entry. No stridor. No wheezing or rales.  Chest:     Chest wall: No tenderness.     Comments: Declined clinical breast exam, gets yearly mammograms. Abdominal:     General: Bowel sounds are normal. There is no distension.     Palpations: Abdomen is soft. There is no shifting dullness, fluid wave, mass or pulsatile mass.     Tenderness: There is no abdominal tenderness. There is no guarding or rebound.  Musculoskeletal:        General: No tenderness or deformity. Normal range of motion.     Cervical back: Normal range of motion and neck supple.     Right lower leg: No edema.     Left lower leg: No edema.  Lymphadenopathy:     Cervical: No cervical adenopathy.  Skin:    General: Skin is warm and dry.     Capillary Refill: Capillary refill takes less than 2 seconds.     Coloration: Skin is not pale.     Findings: No erythema or rash.  Neurological:     Mental Status: She is alert and oriented to person, place, and time.     Cranial Nerves: No cranial nerve deficit.     Motor: No abnormal muscle tone.     Coordination: Coordination normal.     Gait: Gait normal.     Deep Tendon Reflexes: Reflexes are normal and symmetric.  Psychiatric:        Mood and Affect: Mood and affect normal.        Behavior: Behavior normal. Behavior is cooperative.        Thought Content: Thought content normal.        Judgment: Judgment normal.        Assessment/Plan: 1. Encounter for routine adult health examination with abnormal findings Age-appropriate preventive screenings and vaccinations discussed, annual physical exam completed. Routine  labs for health maintenance ordered, see below. PHM updated.  - CBC with Differential/Platelet - CMP14+EGFR - TSH + free T4 - Vitamin D (25 hydroxy) - Lipid Profile - B12 and Folate Panel  2. Mixed hyperlipidemia Routine labs ordered - CBC with Differential/Platelet - CMP14+EGFR - TSH + free T4 - Lipid Profile - B12 and Folate Panel  3. Vitamin D deficiency Routine lab ordered - Vitamin D (25 hydroxy)  4. B12 deficiency Routine labs ordered - CBC with Differential/Platelet - CMP14+EGFR - TSH + free T4 - B12 and Folate Panel  5. Dysuria Routine urinalysis done - UA/M w/rflx Culture, Routine - Microscopic Examination - Urine Culture, Reflex  6. Medication refill - escitalopram (LEXAPRO) 10 MG tablet; TAKE 1 AND 1/2 TABLETS BY MOUTH DAILY  Dispense: 45 tablet; Refill: 3 - ALPRAZolam (XANAX) 0.25 MG tablet; Take 1 tablet (0.25 mg total) by mouth once as needed for up to 1 dose for anxiety (before a flight).  Dispense: 2 tablet; Refill: 0  7. Anxiety with flying Alprazolam prescribed just for the flight - ALPRAZolam (XANAX) 0.25 MG tablet; Take 1 tablet (0.25 mg total) by mouth once as needed for up to 1 dose for anxiety (before a flight).  Dispense: 2 tablet; Refill: 0     General Counseling: Berkleigh verbalizes understanding of the findings of todays visit and agrees with plan of treatment. I have discussed any further diagnostic evaluation that may be needed or ordered today. We also reviewed her medications today.  she has been encouraged to call the office with any questions or concerns that should arise related to todays visit.    Orders Placed This Encounter  Procedures   UA/M w/rflx Culture, Routine   CBC with Differential/Platelet   CMP14+EGFR   TSH + free T4   Vitamin D (25 hydroxy)   Lipid Profile   B12 and Folate Panel    Meds ordered this encounter  Medications   escitalopram (LEXAPRO) 10 MG tablet    Sig: TAKE 1 AND 1/2 TABLETS BY MOUTH DAILY     Dispense:  45 tablet    Refill:  3   ALPRAZolam (XANAX) 0.25 MG tablet    Sig: Take 1 tablet (0.25 mg total) by mouth once as needed for up to 1 dose for anxiety (before a flight).    Dispense:  2 tablet    Refill:  0    Return in about 1 year (around 09/30/2023) for CPE, [place] PCP and otherwise as needed.   Total time spent:30 Minutes Time spent includes review of chart, medications, test results, and follow up plan with the patient.   [place] Controlled Substance Database was reviewed by me.  This patient was seen by Jonetta Osgood, FNP-C in collaboration with Dr. Clayborn Bigness as a part of collaborative care agreement.  Finnbar Cedillos R. Valetta Fuller, MSN, FNP-C Internal medicine

## 2022-10-01 ENCOUNTER — Ambulatory Visit
Admission: RE | Admit: 2022-10-01 | Discharge: 2022-10-01 | Disposition: A | Discharge status: Home / Self Care | Payer: BC Managed Care – PPO | Source: Ambulatory Visit | Attending: Internal Medicine | Admitting: Internal Medicine

## 2022-10-01 DIAGNOSIS — R921 Mammographic calcification found on diagnostic imaging of breast: Secondary | ICD-10-CM | POA: Insufficient documentation

## 2022-10-01 DIAGNOSIS — R928 Other abnormal and inconclusive findings on diagnostic imaging of breast: Secondary | ICD-10-CM | POA: Insufficient documentation

## 2022-10-01 DIAGNOSIS — N6489 Other specified disorders of breast: Secondary | ICD-10-CM

## 2022-10-02 LAB — UA/M W/RFLX CULTURE, ROUTINE
Bilirubin, UA: NEGATIVE
Glucose, UA: NEGATIVE
Ketones, UA: NEGATIVE
Nitrite, UA: NEGATIVE
Protein,UA: NEGATIVE
RBC, UA: NEGATIVE
Specific Gravity, UA: 1.008 (ref 1.005–1.030)
Urobilinogen, Ur: 0.2 mg/dL (ref 0.2–1.0)
pH, UA: 5 (ref 5.0–7.5)

## 2022-10-02 LAB — MICROSCOPIC EXAMINATION
Bacteria, UA: NONE SEEN
Casts: NONE SEEN /lpf
RBC, Urine: NONE SEEN /hpf (ref 0–2)

## 2022-10-02 LAB — URINE CULTURE, REFLEX

## 2022-10-02 NOTE — Progress Notes (Signed)
Pt will need follow up mammo

## 2022-10-26 NOTE — Progress Notes (Signed)
Yes I will, let me review her chart

## 2022-11-03 ENCOUNTER — Ambulatory Visit: Payer: BC Managed Care – PPO | Admitting: Nurse Practitioner

## 2022-11-14 ENCOUNTER — Encounter: Payer: Self-pay | Admitting: Nurse Practitioner

## 2022-11-27 ENCOUNTER — Encounter: Payer: Self-pay | Admitting: Nurse Practitioner

## 2022-11-27 ENCOUNTER — Ambulatory Visit (INDEPENDENT_AMBULATORY_CARE_PROVIDER_SITE_OTHER): Payer: BC Managed Care – PPO | Admitting: Nurse Practitioner

## 2022-11-27 ENCOUNTER — Telehealth: Payer: Self-pay

## 2022-11-27 VITALS — BP 120/75 | HR 90 | Temp 98.4°F | Resp 16 | Ht 65.0 in | Wt 216.8 lb

## 2022-11-27 DIAGNOSIS — Z1339 Encounter for screening examination for other mental health and behavioral disorders: Secondary | ICD-10-CM

## 2022-11-27 DIAGNOSIS — R030 Elevated blood-pressure reading, without diagnosis of hypertension: Secondary | ICD-10-CM | POA: Diagnosis not present

## 2022-11-27 DIAGNOSIS — F900 Attention-deficit hyperactivity disorder, predominantly inattentive type: Secondary | ICD-10-CM | POA: Diagnosis not present

## 2022-11-27 MED ORDER — AMPHETAMINE-DEXTROAMPHET ER 5 MG PO CP24: 5.0000 mg | ORAL_CAPSULE | Freq: Every day | ORAL | 0 refills | Status: DC

## 2022-11-27 NOTE — Progress Notes (Signed)
[facility] [place], [place] [address]  Internal MEDICINE  Office Visit Note  Patient Name: Sophia Garrett  [id number]  [id number]  Date of Service: 11/27/2022  Chief Complaint  Patient presents with   Follow-up    Evaluate for ADHD   Quality Metric Gaps    TDAP    HPI Anakaren presents for a follow-up visit for ADHD screening. --sees therapist and has discussed ADHD with her. Her therapist thinks she has ADHD --seems fidgety --follows through, compensates with being overly critical of self. 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  --high caffeine intake    Adult ADHD Self Report Scale (most recent)     Adult ADHD Self-Report Scale (ASRS-v1.1) Symptom Checklist - 11/27/22 0950       Part A   1. How often do you have trouble wrapping up the final details of a project, once the challenging parts have been done? Very Often  2. How often do you have difficulty getting things done in order when you have to do a task that requires organization? Very Often    3. How often do you have problems remembering appointments or obligations? Very Often  4. When you have a task that requires a lot of thought, how often do you avoid or delay getting started? Very Often    5. How often do you fidget or squirm with your hands or feet when you have to sit down for a long time? Sometimes  6. How often do you feel overly active and compelled to do things, like you were driven by a motor? Sometimes      Part B   7. How often do you make careless mistakes when you have to work on a boring or difficult project? Often  8. How often do you have difficulty keeping your attention when you are doing boring or repetitive work? Very Often    9. How often do you have difficulty concentrating on what people say to you, even when they are speaking to you  directly? Very Often  10. How often do you misplace or have difficulty finding things at home or at work? Very Often    6. How often are you distracted by activity or noise around you? Sometimes  12. How often do you leave your seat in meetings or other situations in which you are expected to remain seated? Sometimes    13. How often do you feel restless or fidgety? Sometimes  14. How often do you have difficulty unwinding and relaxing when you have time to yourself? Very Often    70. How often do you find yourself talking too much when you are in social situations? Very Often  87. When you are in a conversation, how often do you find yourself finishing the sentences of the people you are talking to, before they can finish them themselves? Very Often    34. How often do you have difficulty waiting your turn in situations when turn taking is required? Very Often  36. How often do you interrupt others when they are busy? Sometimes      Comment   How old were you when these problems first began to occur? 6                  Current Medication: Outpatient Encounter Medications as of 11/27/2022  Medication Sig  ALPRAZolam (XANAX) 0.25 MG tablet Take 1 tablet (0.25 mg total) by mouth once as needed for up to 1 dose for anxiety (before a flight).   amphetamine-dextroamphetamine (ADDERALL XR) 5 MG 24 hr capsule Take 1 capsule (5 mg total) by mouth daily.   cetirizine (ZYRTEC) 10 MG tablet Take 10 mg by mouth daily.   escitalopram (LEXAPRO) 10 MG tablet TAKE 1 AND 1/2 TABLETS BY MOUTH DAILY   No facility-administered encounter medications on file as of 11/27/2022.    Surgical History: Past Surgical History:  Procedure Laterality Date   MOLE REMOVAL Right    right side of chest    Medical History: Past Medical History:  Diagnosis Date   Allergy    Anxiety     Family History: Family History  Problem Relation Age of Onset   Hyperlipidemia Mother    Skin cancer Mother        'not  melanoma'   Alcohol abuse Father    Heart disease Father    Hypertension Father    Arrhythmia Sister    Cancer Maternal Aunt        Breast Cancer   Cancer Paternal Aunt        Breast Cancer   Cancer Paternal Aunt        Breast Cancer   Cancer Maternal Grandmother        Breast Cancer   Cancer Maternal Grandfather        Prostate/Lung Cancer   Cancer Paternal Grandmother        Breast Cancer   Cancer Paternal Grandfather        Prostate Cancer   Colon cancer Neg Hx     Social History   Socioeconomic History   Marital status: Married    Spouse name: Not on file   Number of children: Not on file   Years of education: Not on file   Highest education level: Not on file  Occupational History   Not on file  Tobacco Use   Smoking status: Never   Smokeless tobacco: Never  Vaping Use   Vaping Use: Never used  Substance and Sexual Activity   Alcohol use: Yes    Alcohol/week: 0.0 standard drinks of alcohol    Comment: Occasionaly    Drug use: No   Sexual activity: Yes    Partners: Male    Birth control/protection: None    Comment: Husband  Other Topics Concern   Not on file  Social History Narrative   Married   Masters Degree   Caffeine- Soda diet Dr. Malachi Bonds 3 cans daily, no coffee    Children- 3    Pets: 1 dog     Social Determinants of Health   Financial Resource Strain: Not on file  Food Insecurity: Not on file  Transportation Needs: Not on file  Physical Activity: Not on file  Stress: Not on file  Social Connections: Not on file  Intimate Partner Violence: Not on file      Review of Systems  Constitutional: Negative.  Negative for fatigue.  Respiratory: Negative.  Negative for cough, chest tightness, shortness of breath and wheezing.   Cardiovascular: Negative.  Negative for chest pain and palpitations.  Gastrointestinal: Negative.   Genitourinary: Negative.   Musculoskeletal: Negative.   Neurological: Negative.   Psychiatric/Behavioral:  Positive  for decreased concentration and sleep disturbance. Negative for self-injury and suicidal ideas. The patient is nervous/anxious.     Vital Signs: BP 120/75   Pulse 90  Temp 98.4 F (36.9 C)   Resp 16   Ht '5\' 5"'$  (1.651 m)   Wt 216 lb 12.8 oz (98.3 kg)   SpO2 98%   BMI 36.08 kg/m    Physical Exam Vitals reviewed.  Constitutional:      General: She is not in acute distress.    Appearance: Normal appearance. She is obese. She is not ill-appearing.  HENT:     Head: Normocephalic and atraumatic.  Eyes:     Pupils: Pupils are equal, round, and reactive to light.  Cardiovascular:     Rate and Rhythm: Normal rate and regular rhythm.  Pulmonary:     Effort: Pulmonary effort is normal. No respiratory distress.  Neurological:     Mental Status: She is alert and oriented to person, place, and time.  Psychiatric:        Attention and Perception: She is inattentive.        Mood and Affect: Mood is anxious. Affect is labile.        Speech: Speech is rapid and pressured.        Behavior: Behavior is hyperactive.        Thought Content: Thought content is not paranoid. Thought content does not include homicidal or suicidal ideation.        Cognition and Memory: She exhibits impaired recent memory.        Judgment: Judgment normal.        Assessment/Plan: 1. Elevated blood pressure reading in office with white coat syndrome, without diagnosis of hypertension BP returned to normal when rechecked manually, patient also has anxiety which can raise her blood pressure as well.   2. Attention deficit hyperactivity disorder (ADHD), predominantly inattentive type Predominantly inattentive, will try small dose of adderall XR. Follow up in 4 weeks to discuss efficacy.  - amphetamine-dextroamphetamine (ADDERALL XR) 5 MG 24 hr capsule; Take 1 capsule (5 mg total) by mouth daily.  Dispense: 30 capsule; Refill: 0  3. Attention deficit hyperactivity disorder (ADHD) evaluation ADHD diagnosis  determined based on Adult ASRS answers, clinical observation and exam, and subjective assessment during office visit   General Counseling: Rita verbalizes understanding of the findings of todays visit and agrees with plan of treatment. I have discussed any further diagnostic evaluation that may be needed or ordered today. We also reviewed her medications today. she has been encouraged to call the office with any questions or concerns that should arise related to todays visit.    No orders of the defined types were placed in this encounter.   Meds ordered this encounter  Medications   amphetamine-dextroamphetamine (ADDERALL XR) 5 MG 24 hr capsule    Sig: Take 1 capsule (5 mg total) by mouth daily.    Dispense:  30 capsule    Refill:  0    Please fill today    Return in about 4 weeks (around 12/25/2022) for F/U, eval new med, ADHD med check, [place] PCP.   Total time spent:30 Minutes Time spent includes review of chart, medications, test results, and follow up plan with the patient.   [place] Controlled Substance Database was reviewed by me.  This patient was seen by Jonetta Osgood, FNP-C in collaboration with Dr. Clayborn Bigness as a part of collaborative care agreement.   Narissa Beaufort R. Valetta Fuller, MSN, FNP-C Internal medicine

## 2022-11-27 NOTE — Telephone Encounter (Signed)
Sent P.A. for patient's Adderall.

## 2022-12-03 ENCOUNTER — Telehealth: Payer: Self-pay

## 2022-12-03 NOTE — Telephone Encounter (Signed)
Patient was denied for Adderall.

## 2022-12-28 ENCOUNTER — Ambulatory Visit (INDEPENDENT_AMBULATORY_CARE_PROVIDER_SITE_OTHER): Payer: BC Managed Care – PPO | Admitting: Nurse Practitioner

## 2022-12-28 ENCOUNTER — Encounter: Payer: Self-pay | Admitting: Nurse Practitioner

## 2022-12-28 VITALS — BP 127/86 | HR 97 | Temp 98.2°F | Resp 16 | Ht 65.0 in | Wt 214.4 lb

## 2022-12-28 DIAGNOSIS — E661 Drug-induced obesity: Secondary | ICD-10-CM | POA: Diagnosis not present

## 2022-12-28 DIAGNOSIS — R030 Elevated blood-pressure reading, without diagnosis of hypertension: Secondary | ICD-10-CM

## 2022-12-28 DIAGNOSIS — F900 Attention-deficit hyperactivity disorder, predominantly inattentive type: Secondary | ICD-10-CM

## 2022-12-28 DIAGNOSIS — Z6835 Body mass index (BMI) 35.0-35.9, adult: Secondary | ICD-10-CM

## 2022-12-28 MED ORDER — AMPHETAMINE-DEXTROAMPHET ER 5 MG PO CP24: 5.0000 mg | ORAL_CAPSULE | Freq: Every day | ORAL | 0 refills | Status: DC

## 2022-12-28 NOTE — Progress Notes (Signed)
[facility] [place],  [address]  Internal MEDICINE  Office Visit Note  Patient Name: Sophia Garrett  [id number]  [id number]  Date of Service: 12/28/2022  Chief Complaint  Patient presents with   Follow-up    HPI Sophia Garrett presents for a follow-up visit for ADHD,  Started on adderall xr generic 5 mg daily. Has been taking for the past 2 weeks.  Can finish tasks and do things in an organized way. Mind feels quieter and not swirling, can plan better.  Saw therapist this morning and discussed the ADHD symptoms and medication with them as well. Therapist noticed improvement of symptoms as well.     Current Medication: Outpatient Encounter Medications as of 12/28/2022  Medication Sig   ALPRAZolam (XANAX) 0.25 MG tablet Take 1 tablet (0.25 mg total) by mouth once as needed for up to 1 dose for anxiety (before a flight).   cetirizine (ZYRTEC) 10 MG tablet Take 10 mg by mouth daily.   escitalopram (LEXAPRO) 10 MG tablet TAKE 1 AND 1/2 TABLETS BY MOUTH DAILY   [DISCONTINUED] amphetamine-dextroamphetamine (ADDERALL XR) 5 MG 24 hr capsule Take 1 capsule (5 mg total) by mouth daily.   amphetamine-dextroamphetamine (ADDERALL XR) 5 MG 24 hr capsule Take 1 capsule (5 mg total) by mouth daily.   [START ON 01/25/2023] amphetamine-dextroamphetamine (ADDERALL XR) 5 MG 24 hr capsule Take 1 capsule (5 mg total) by mouth daily.   [START ON 02/22/2023] amphetamine-dextroamphetamine (ADDERALL XR) 5 MG 24 hr capsule Take 1 capsule (5 mg total) by mouth daily.   No facility-administered encounter medications on file as of 12/28/2022.    Surgical History: Past Surgical History:  Procedure Laterality Date   MOLE REMOVAL Right    right side of chest    Medical History: Past Medical History:  Diagnosis Date   Allergy    Anxiety     Family History: Family History  Problem Relation Age of Onset   Hyperlipidemia Mother    Skin cancer Mother        'not melanoma'    Alcohol abuse Father    Heart disease Father    Hypertension Father    Arrhythmia Sister    Cancer Maternal Aunt        Breast Cancer   Cancer Paternal Aunt        Breast Cancer   Cancer Paternal Aunt        Breast Cancer   Cancer Maternal Grandmother        Breast Cancer   Cancer Maternal Grandfather        Prostate/Lung Cancer   Cancer Paternal Grandmother        Breast Cancer   Cancer Paternal Grandfather        Prostate Cancer   Colon cancer Neg Hx     Social History   Socioeconomic History   Marital status: Married    Spouse name: Not on file   Number of children: Not on file   Years of education: Not on file   Highest education level: Not on file  Occupational History   Not on file  Tobacco Use   Smoking status: Never   Smokeless tobacco: Never  Vaping Use   Vaping Use: Never used  Substance and Sexual Activity   Alcohol use: Yes    Alcohol/week: 0.0 standard drinks of alcohol    Comment: Occasionaly    Drug use: No   Sexual activity: Yes    Partners: Male  Birth control/protection: None    Comment: Husband  Other Topics Concern   Not on file  Social History Narrative   Married   Masters Degree   Caffeine- Soda diet Dr. Malachi Bonds 3 cans daily, no coffee    Children- 3    Pets: 1 dog     Social Determinants of Health   Financial Resource Strain: Not on file  Food Insecurity: Not on file  Transportation Needs: Not on file  Physical Activity: Not on file  Stress: Not on file  Social Connections: Not on file  Intimate Partner Violence: Not on file      Review of Systems  Constitutional: Negative.  Negative for fatigue.  Respiratory: Negative.  Negative for cough, chest tightness, shortness of breath and wheezing.   Cardiovascular: Negative.  Negative for chest pain and palpitations.  Gastrointestinal: Negative.   Genitourinary: Negative.   Musculoskeletal: Negative.   Neurological: Negative.   Psychiatric/Behavioral:  Positive for decreased  concentration and sleep disturbance. Negative for self-injury and suicidal ideas. The patient is nervous/anxious.     Vital Signs: BP 127/86   Pulse 97   Temp 98.2 F (36.8 C)   Resp 16   Ht '5\' 5"'$  (1.651 m)   Wt 214 lb 6.4 oz (97.3 kg)   SpO2 98%   BMI 35.68 kg/m    Physical Exam Vitals reviewed.  Constitutional:      General: She is not in acute distress.    Appearance: Normal appearance. She is obese. She is not ill-appearing.  HENT:     Head: Normocephalic and atraumatic.  Eyes:     Pupils: Pupils are equal, round, and reactive to light.  Cardiovascular:     Rate and Rhythm: Normal rate and regular rhythm.  Pulmonary:     Effort: Pulmonary effort is normal. No respiratory distress.  Neurological:     Mental Status: She is alert and oriented to person, place, and time.  Psychiatric:        Attention and Perception: She is attentive.        Mood and Affect: Mood is not anxious. Affect is not labile.        Speech: Speech is not rapid and pressured.        Behavior: Behavior is not hyperactive.        Thought Content: Thought content is not paranoid. Thought content does not include homicidal or suicidal ideation.        Cognition and Memory: She does not exhibit impaired recent memory.        Judgment: Judgment normal.        Assessment/Plan: 1. Elevated blood pressure reading in office with white coat syndrome, without diagnosis of hypertension BP is much better today, will continue to monitor at future office visits.   2. 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 Attention deficit hyperactivity disorder (ADHD), predominantly inattentive type Current dose is effective.  - amphetamine-dextroamphetamine (ADDERALL XR) 5 MG 24 hr capsule; Take 1 capsule (5 mg total) by mouth daily.  Dispense: 30 capsule; Refill: 0 -  amphetamine-dextroamphetamine (ADDERALL XR) 5 MG 24 hr capsule; Take 1 capsule (5 mg total) by mouth daily.  Dispense: 30 capsule; Refill: 0 - amphetamine-dextroamphetamine (ADDERALL XR) 5 MG 24 hr capsule; Take 1 capsule (5 mg total) by mouth daily.  Dispense: 30 capsule; Refill: 0  General Counseling: Sophia Garrett verbalizes understanding of the findings of todays visit and agrees with plan of treatment. I have discussed any further diagnostic evaluation that may be needed or ordered today. We also reviewed her medications today. she has been encouraged to call the office with any questions or concerns that should arise related to todays visit.    No orders of the defined types were placed in this encounter.   Meds ordered this encounter  Medications   amphetamine-dextroamphetamine (ADDERALL XR) 5 MG 24 hr capsule    Sig: Take 1 capsule (5 mg total) by mouth daily.    Dispense:  30 capsule    Refill:  0    Fill for january   amphetamine-dextroamphetamine (ADDERALL XR) 5 MG 24 hr capsule    Sig: Take 1 capsule (5 mg total) by mouth daily.    Dispense:  30 capsule    Refill:  0    Fill for february   amphetamine-dextroamphetamine (ADDERALL XR) 5 MG 24 hr capsule    Sig: Take 1 capsule (5 mg total) by mouth daily.    Dispense:  30 capsule    Refill:  0    Fill for march    Return in about 12 weeks (around 03/22/2023) for F/U, ADHD med check, Quynn Vilchis PCP.   Total time spent:30 Minutes Time spent includes review of chart, medications, test results, and follow up plan with the patient.   [place] Controlled Substance Database was reviewed by me.  This patient was seen by Jonetta Osgood, FNP-C in collaboration with Dr. Clayborn Bigness as a part of collaborative care agreement.   Johnathen Testa R. Valetta Fuller, MSN, FNP-C Internal medicine

## 2022-12-30 ENCOUNTER — Encounter: Payer: Self-pay | Admitting: Nurse Practitioner

## 2022-12-31 NOTE — Telephone Encounter (Signed)
Spoke with pt that check with phar that med covered if not they need to send PA request

## 2023-01-04 ENCOUNTER — Other Ambulatory Visit: Payer: Self-pay | Admitting: Nurse Practitioner

## 2023-01-04 DIAGNOSIS — F900 Attention-deficit hyperactivity disorder, predominantly inattentive type: Secondary | ICD-10-CM | POA: Insufficient documentation

## 2023-01-04 MED ORDER — AMPHETAMINE-DEXTROAMPHET ER 5 MG PO CP24: 5.0000 mg | ORAL_CAPSULE | Freq: Every day | ORAL | 0 refills | Status: DC

## 2023-02-01 ENCOUNTER — Ambulatory Visit: Admit: 2023-02-01 | Discharge status: Against Medical Advice | Payer: BC Managed Care – PPO

## 2023-02-26 ENCOUNTER — Other Ambulatory Visit: Payer: Self-pay | Admitting: Nurse Practitioner

## 2023-02-26 DIAGNOSIS — Z760 Encounter for issue of repeat prescription: Secondary | ICD-10-CM

## 2023-03-31 ENCOUNTER — Ambulatory Visit (INDEPENDENT_AMBULATORY_CARE_PROVIDER_SITE_OTHER): Payer: BC Managed Care – PPO | Admitting: Nurse Practitioner

## 2023-03-31 ENCOUNTER — Telehealth: Payer: Self-pay

## 2023-03-31 ENCOUNTER — Encounter: Payer: Self-pay | Admitting: Nurse Practitioner

## 2023-03-31 VITALS — BP 115/77 | HR 96 | Temp 98.4°F | Resp 16 | Ht 65.0 in | Wt 215.0 lb

## 2023-03-31 DIAGNOSIS — F900 Attention-deficit hyperactivity disorder, predominantly inattentive type: Secondary | ICD-10-CM | POA: Diagnosis not present

## 2023-03-31 DIAGNOSIS — H60541 Acute eczematoid otitis externa, right ear: Secondary | ICD-10-CM | POA: Diagnosis not present

## 2023-03-31 DIAGNOSIS — F411 Generalized anxiety disorder: Secondary | ICD-10-CM | POA: Diagnosis not present

## 2023-03-31 MED ORDER — AMPHETAMINE-DEXTROAMPHET ER 10 MG PO CP24: 10.0000 mg | ORAL_CAPSULE | Freq: Every day | ORAL | 0 refills | Status: DC

## 2023-03-31 MED ORDER — ESCITALOPRAM OXALATE 10 MG PO TABS: ORAL_TABLET | ORAL | 3 refills | Status: DC

## 2023-03-31 MED ORDER — TRIAMCINOLONE ACETONIDE 0.025 % EX OINT: 1.0000 | TOPICAL_OINTMENT | Freq: Two times a day (BID) | CUTANEOUS | 3 refills | Status: AC

## 2023-03-31 NOTE — Progress Notes (Signed)
[facility] [address] [place], [place] [address]  Internal MEDICINE  Office Visit Note  Patient Name: Sophia Garrett  [id number]  [id number]  Date of Service: 03/31/2023  Chief Complaint  Patient presents with   Follow-up    HPI Sophia Garrett presents for a follow-up visit for ADHD, anxiety and rash of right ear.  ADHD -- heart rate and BP are normal. Denies any palpitations or other adverse side effects of the medication. Wants to increase dose slightly since planning to wean off Wants to wean off lexapro now that she is doing well on adderall.  Rash of right ear --Dry skin/rash in right ear.    Current Medication: Outpatient Encounter Medications as of 03/31/2023  Medication Sig   ALPRAZolam (XANAX) 0.25 MG tablet Take 1 tablet (0.25 mg total) by mouth once as needed for up to 1 dose for anxiety (before a flight).   amphetamine-dextroamphetamine (ADDERALL XR) 10 MG 24 hr capsule Take 1 capsule (10 mg total) by mouth daily.   [START ON 04/28/2023] amphetamine-dextroamphetamine (ADDERALL XR) 10 MG 24 hr capsule Take 1 capsule (10 mg total) by mouth daily.   [START ON 05/26/2023] amphetamine-dextroamphetamine (ADDERALL XR) 10 MG 24 hr capsule Take 1 capsule (10 mg total) by mouth daily.   cetirizine (ZYRTEC) 10 MG tablet Take 10 mg by mouth daily.   triamcinolone (KENALOG) 0.025 % ointment Apply 1 Application topically 2 (two) times daily.   [DISCONTINUED] amphetamine-dextroamphetamine (ADDERALL XR) 5 MG 24 hr capsule Take 1 capsule (5 mg total) by mouth daily.   [DISCONTINUED] amphetamine-dextroamphetamine (ADDERALL XR) 5 MG 24 hr capsule Take 1 capsule (5 mg total) by mouth daily.   [DISCONTINUED] amphetamine-dextroamphetamine (ADDERALL XR) 5 MG 24 hr capsule Take 1 capsule (5 mg total) by mouth daily.   [DISCONTINUED] escitalopram (LEXAPRO) 10 MG tablet TAKE 1 AND 1/2 TABLETS DAILY BY MOUTH   escitalopram (LEXAPRO) 10 MG tablet TAKE 1/2 tablet PO daily.   No  facility-administered encounter medications on file as of 03/31/2023.    Surgical History: Past Surgical History:  Procedure Laterality Date   MOLE REMOVAL Right    right side of chest    Medical History: Past Medical History:  Diagnosis Date   Allergy    Anxiety     Family History: Family History  Problem Relation Age of Onset   Hyperlipidemia Mother    Skin cancer Mother        'not melanoma'   Alcohol abuse Father    Heart disease Father    Hypertension Father    Arrhythmia Sister    Cancer Maternal Aunt        Breast Cancer   Cancer Paternal Aunt        Breast Cancer   Cancer Paternal Aunt        Breast Cancer   Cancer Maternal Grandmother        Breast Cancer   Cancer Maternal Grandfather        Prostate/Lung Cancer   Cancer Paternal Grandmother        Breast Cancer   Cancer Paternal Grandfather        Prostate Cancer   Colon cancer Neg Hx     Social History   Socioeconomic History   Marital status: Married    Spouse name: Not on file   Number of children: Not on file   Years of education: Not on file   Highest education level: Not on file  Occupational History   Not  on file  Tobacco Use   Smoking status: Never   Smokeless tobacco: Never  Vaping Use   Vaping Use: Never used  Substance and Sexual Activity   Alcohol use: Yes    Alcohol/week: 0.0 standard drinks of alcohol    Comment: Occasionaly    Drug use: No   Sexual activity: Yes    Partners: Male    Birth control/protection: None    Comment: Husband  Other Topics Concern   Not on file  Social History Narrative   Married   Masters Degree   Caffeine- Soda diet Dr. Reino Kent 3 cans daily, no coffee    Children- 3    Pets: 1 dog     Social Determinants of Health   Financial Resource Strain: Not on file  Food Insecurity: Not on file  Transportation Needs: Not on file  Physical Activity: Not on file  Stress: Not on file  Social Connections: Not on file  Intimate Partner Violence: Not  on file      Review of Systems  Constitutional: Negative.  Negative for fatigue.  HENT:         Dry skin rash on right ear  Respiratory: Negative.  Negative for cough, chest tightness, shortness of breath and wheezing.   Cardiovascular: Negative.  Negative for chest pain and palpitations.  Gastrointestinal: Negative.   Genitourinary: Negative.   Musculoskeletal: Negative.   Neurological: Negative.   Psychiatric/Behavioral:  Positive for decreased concentration and sleep disturbance. Negative for self-injury and suicidal ideas. The patient is nervous/anxious.     Vital Signs: BP 115/77   Pulse 96   Temp 98.4 F (36.9 C)   Resp 16   Ht 5\' 5"  (1.651 m)   Wt 215 lb (97.5 kg)   SpO2 99%   BMI 35.78 kg/m    Physical Exam Vitals reviewed.  Constitutional:      General: She is not in acute distress.    Appearance: Normal appearance. She is obese. She is not ill-appearing.  HENT:     Head: Normocephalic and atraumatic.     Ears:     Comments: Dry scaly rash on external ear right side  Eyes:     Pupils: Pupils are equal, round, and reactive to light.  Cardiovascular:     Rate and Rhythm: Normal rate and regular rhythm.  Pulmonary:     Effort: Pulmonary effort is normal. No respiratory distress.  Neurological:     Mental Status: She is alert and oriented to person, place, and time.  Psychiatric:        Attention and Perception: She is attentive.        Mood and Affect: Mood is not anxious. Affect is not labile.        Speech: Speech is not rapid and pressured.        Behavior: Behavior is not hyperactive.        Thought Content: Thought content is not paranoid. Thought content does not include homicidal or suicidal ideation.        Cognition and Memory: She does not exhibit impaired recent memory.        Judgment: Judgment normal.        Assessment/Plan: 1. Acute eczematoid otitis externa of right ear Apply to affected area on right ear until resolved. -  triamcinolone (KENALOG) 0.025 % ointment; Apply 1 Application topically 2 (two) times daily.  Dispense: 30 g; Refill: 3  2. Attention deficit hyperactivity disorder (ADHD), predominantly inattentive type Dose increased to  10 mg daily. Follow up in 3 months for additional refills  - amphetamine-dextroamphetamine (ADDERALL XR) 10 MG 24 hr capsule; Take 1 capsule (10 mg total) by mouth daily.  Dispense: 30 capsule; Refill: 0 - amphetamine-dextroamphetamine (ADDERALL XR) 10 MG 24 hr capsule; Take 1 capsule (10 mg total) by mouth daily.  Dispense: 30 capsule; Refill: 0 - amphetamine-dextroamphetamine (ADDERALL XR) 10 MG 24 hr capsule; Take 1 capsule (10 mg total) by mouth daily.  Dispense: 30 capsule; Refill: 0  3. Generalized anxiety disorder Weaning off lexapro, doing better with ADHD med controlling symptoms.  - escitalopram (LEXAPRO) 10 MG tablet; TAKE 1/2 tablet PO daily.  Dispense: 45 tablet; Refill: 3   General Counseling: Sophia Garrett verbalizes understanding of the findings of todays visit and agrees with plan of treatment. I have discussed any further diagnostic evaluation that may be needed or ordered today. We also reviewed her medications today. she has been encouraged to call the office with any questions or concerns that should arise related to todays visit.    No orders of the defined types were placed in this encounter.   Meds ordered this encounter  Medications   amphetamine-dextroamphetamine (ADDERALL XR) 10 MG 24 hr capsule    Sig: Take 1 capsule (10 mg total) by mouth daily.    Dispense:  30 capsule    Refill:  0   amphetamine-dextroamphetamine (ADDERALL XR) 10 MG 24 hr capsule    Sig: Take 1 capsule (10 mg total) by mouth daily.    Dispense:  30 capsule    Refill:  0    Fill for may   amphetamine-dextroamphetamine (ADDERALL XR) 10 MG 24 hr capsule    Sig: Take 1 capsule (10 mg total) by mouth daily.    Dispense:  30 capsule    Refill:  0    Fill for june   escitalopram  (LEXAPRO) 10 MG tablet    Sig: TAKE 1/2 tablet PO daily.    Dispense:  45 tablet    Refill:  3   triamcinolone (KENALOG) 0.025 % ointment    Sig: Apply 1 Application topically 2 (two) times daily.    Dispense:  30 g    Refill:  3    Return in about 12 weeks (around 06/23/2023) for F/U, ADHD med check, Sophia Garrett PCP.   Total time spent:30 Minutes Time spent includes review of chart, medications, test results, and follow up plan with the patient.   [place] Controlled Substance Database was reviewed by me.  This patient was seen by Sallyanne Kuster, FNP-C in collaboration with Dr. Beverely Risen as a part of collaborative care agreement.   Rubert Frediani R. Tedd Sias, MSN, FNP-C Internal medicine

## 2023-03-31 NOTE — Telephone Encounter (Signed)
PA was done for Amphetamine-Dextroamphet.

## 2023-04-01 ENCOUNTER — Telehealth: Payer: Self-pay

## 2023-04-01 NOTE — Telephone Encounter (Signed)
PA was approved for Amphetamine-Dextroamphet.

## 2023-06-24 ENCOUNTER — Ambulatory Visit: Payer: BC Managed Care – PPO | Admitting: Nurse Practitioner

## 2023-06-24 ENCOUNTER — Encounter: Payer: Self-pay | Admitting: Nurse Practitioner

## 2023-06-24 VITALS — BP 138/88 | HR 100 | Temp 98.5°F | Resp 16 | Ht 65.0 in | Wt 206.6 lb

## 2023-06-24 DIAGNOSIS — F900 Attention-deficit hyperactivity disorder, predominantly inattentive type: Secondary | ICD-10-CM | POA: Diagnosis not present

## 2023-06-24 DIAGNOSIS — E661 Drug-induced obesity: Secondary | ICD-10-CM

## 2023-06-24 DIAGNOSIS — F411 Generalized anxiety disorder: Secondary | ICD-10-CM | POA: Diagnosis not present

## 2023-06-24 DIAGNOSIS — Z6835 Body mass index (BMI) 35.0-35.9, adult: Secondary | ICD-10-CM

## 2023-06-24 DIAGNOSIS — R635 Abnormal weight gain: Secondary | ICD-10-CM

## 2023-06-24 DIAGNOSIS — F40243 Fear of flying: Secondary | ICD-10-CM | POA: Diagnosis not present

## 2023-06-24 MED ORDER — AMPHETAMINE-DEXTROAMPHET ER 10 MG PO CP24: 10.0000 mg | ORAL_CAPSULE | Freq: Every day | ORAL | 0 refills | Status: DC

## 2023-06-24 MED ORDER — ESCITALOPRAM OXALATE 5 MG PO TABS: 7.5000 mg | ORAL_TABLET | Freq: Every day | ORAL | 2 refills | Status: DC

## 2023-06-24 MED ORDER — ALPRAZOLAM 0.25 MG PO TABS: 0.2500 mg | ORAL_TABLET | ORAL | 0 refills | Status: DC | PRN

## 2023-06-24 NOTE — Progress Notes (Signed)
[facility] [address] [place], [place] [address]  Internal MEDICINE  Office Visit Note  Patient Name: Sophia Garrett  [id number]  [id number]  Date of Service: 06/24/2023  Chief Complaint  Patient presents with   Follow-up    HPI Lokelani presents for a follow-up visit for ADHD, weight loss and anxiety.  Anxiety -- anxiety when flying on a plane, has an upcoming trip with multiple flights with layovers.  ADHD-- current dose remains effective, BP and heart rate are normal, denies any palpitations or other adverse side effects of the medication Weight loss of 9 lbs since previous office visit.     Current Medication: Outpatient Encounter Medications as of 06/24/2023  Medication Sig   cetirizine (ZYRTEC) 10 MG tablet Take 10 mg by mouth daily.   escitalopram (LEXAPRO) 5 MG tablet Take 1.5 tablets (7.5 mg total) by mouth daily. Then wean to 1 tablet daily at patient discretion.   triamcinolone (KENALOG) 0.025 % ointment Apply 1 Application topically 2 (two) times daily.   [DISCONTINUED] ALPRAZolam (XANAX) 0.25 MG tablet Take 1 tablet (0.25 mg total) by mouth once as needed for up to 1 dose for anxiety (before a flight).   [DISCONTINUED] amphetamine-dextroamphetamine (ADDERALL XR) 10 MG 24 hr capsule Take 1 capsule (10 mg total) by mouth daily.   [DISCONTINUED] amphetamine-dextroamphetamine (ADDERALL XR) 10 MG 24 hr capsule Take 1 capsule (10 mg total) by mouth daily.   [DISCONTINUED] amphetamine-dextroamphetamine (ADDERALL XR) 10 MG 24 hr capsule Take 1 capsule (10 mg total) by mouth daily.   [DISCONTINUED] escitalopram (LEXAPRO) 10 MG tablet TAKE 1/2 tablet PO daily.   ALPRAZolam (XANAX) 0.25 MG tablet Take 1 tablet (0.25 mg total) by mouth as needed for up to 6 doses for anxiety (before a flight).   amphetamine-dextroamphetamine (ADDERALL XR) 10 MG 24 hr capsule Take 1 capsule (10 mg total) by mouth daily.   [START ON 07/22/2023] amphetamine-dextroamphetamine (ADDERALL XR)  10 MG 24 hr capsule Take 1 capsule (10 mg total) by mouth daily.   [START ON 08/19/2023] amphetamine-dextroamphetamine (ADDERALL XR) 10 MG 24 hr capsule Take 1 capsule (10 mg total) by mouth daily.   No facility-administered encounter medications on file as of 06/24/2023.    Surgical History: Past Surgical History:  Procedure Laterality Date   MOLE REMOVAL Right    right side of chest    Medical History: Past Medical History:  Diagnosis Date   Allergy    Anxiety     Family History: Family History  Problem Relation Age of Onset   Hyperlipidemia Mother    Skin cancer Mother        'not melanoma'   Alcohol abuse Father    Heart disease Father    Hypertension Father    Arrhythmia Sister    Cancer Maternal Aunt        Breast Cancer   Cancer Paternal Aunt        Breast Cancer   Cancer Paternal Aunt        Breast Cancer   Cancer Maternal Grandmother        Breast Cancer   Cancer Maternal Grandfather        Prostate/Lung Cancer   Cancer Paternal Grandmother        Breast Cancer   Cancer Paternal Grandfather        Prostate Cancer   Colon cancer Neg Hx     Social History   Socioeconomic History   Marital status: Married    Spouse  name: Not on file   Number of children: Not on file   Years of education: Not on file   Highest education level: Not on file  Occupational History   Not on file  Tobacco Use   Smoking status: Never   Smokeless tobacco: Never  Vaping Use   Vaping status: Never Used  Substance and Sexual Activity   Alcohol use: Yes    Comment: rare   Drug use: No   Sexual activity: Yes    Partners: Male    Birth control/protection: None    Comment: Husband  Other Topics Concern   Not on file  Social History Narrative   Married   Masters Degree   Caffeine- Soda diet Dr. Reino Kent 3 cans daily, no coffee    Children- 3    Pets: 1 dog     Social Determinants of Health   Financial Resource Strain: Not on file  Food Insecurity: Not on file   Transportation Needs: Not on file  Physical Activity: Not on file  Stress: Not on file  Social Connections: Not on file  Intimate Partner Violence: Not on file      Review of Systems  Constitutional: Negative.  Negative for fatigue.  Respiratory: Negative.  Negative for cough, chest tightness, shortness of breath and wheezing.   Cardiovascular: Negative.  Negative for chest pain and palpitations.  Gastrointestinal: Negative.   Genitourinary: Negative.   Musculoskeletal: Negative.   Neurological: Negative.   Psychiatric/Behavioral:  Positive for decreased concentration and sleep disturbance. Negative for self-injury and suicidal ideas. The patient is nervous/anxious.     Vital Signs: BP 138/88   Pulse 100   Temp 98.5 F (36.9 C)   Resp 16   Ht 5\' 5"  (1.651 m)   Wt 206 lb 9.6 oz (93.7 kg)   SpO2 98%   BMI 34.38 kg/m    Physical Exam Vitals reviewed.  Constitutional:      General: She is not in acute distress.    Appearance: Normal appearance. She is obese. She is not ill-appearing.  HENT:     Head: Normocephalic and atraumatic.     Ears:     Comments: Dry scaly rash on external ear right side  Eyes:     Pupils: Pupils are equal, round, and reactive to light.  Cardiovascular:     Rate and Rhythm: Normal rate and regular rhythm.  Pulmonary:     Effort: Pulmonary effort is normal. No respiratory distress.  Neurological:     Mental Status: She is alert and oriented to person, place, and time.  Psychiatric:        Attention and Perception: She is attentive.        Mood and Affect: Mood is not anxious. Affect is not labile.        Speech: Speech is not rapid and pressured.        Behavior: Behavior is not hyperactive.        Thought Content: Thought content is not paranoid. Thought content does not include homicidal or suicidal ideation.        Cognition and Memory: She does not exhibit impaired recent memory.        Judgment: Judgment normal.        Assessment/Plan: 1. Abnormal weight gain Continue diet and lifestyle modifications and continue medications as prescribed.   2. 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 Attention deficit hyperactivity disorder (ADHD), predominantly inattentive type Refills x3 months ordered, continue medication as prescribed. Follow up in 3 months for additional refills. - amphetamine-dextroamphetamine (ADDERALL XR) 10 MG 24 hr capsule; Take 1 capsule (10 mg total) by mouth daily.  Dispense: 30 capsule; Refill: 0 - amphetamine-dextroamphetamine (ADDERALL XR) 10 MG 24 hr capsule; Take 1 capsule (10 mg total) by mouth daily.  Dispense: 30 capsule; Refill: 0 - amphetamine-dextroamphetamine (ADDERALL XR) 10 MG 24 hr capsule; Take 1 capsule (10 mg total) by mouth daily.  Dispense: 30 capsule; Refill: 0  4. Anxiety with flying Take alprazolam as prescribed for upcoming flights.  - ALPRAZolam (XANAX) 0.25 MG tablet; Take 1 tablet (0.25 mg total) by mouth as needed for up to 6 doses for anxiety (before a flight).  Dispense: 6 tablet; Refill: 0  5. Generalized anxiety disorder Continue to wean off escitalopram as prescribed and discussed - escitalopram (LEXAPRO) 5 MG tablet; Take 1.5 tablets (7.5 mg total) by mouth daily. Then wean to 1 tablet daily at patient discretion.  Dispense: 45 tablet; Refill: 2   General Counseling: Ahriyah verbalizes understanding of the findings of todays visit and agrees with plan of treatment. I have discussed any further diagnostic evaluation that may be needed or ordered today. We also reviewed her medications today. she has been encouraged to call the office with any questions or concerns that should arise related to todays visit.    No orders of the defined types were placed in this encounter.   Meds ordered this encounter  Medications    amphetamine-dextroamphetamine (ADDERALL XR) 10 MG 24 hr capsule    Sig: Take 1 capsule (10 mg total) by mouth daily.    Dispense:  30 capsule    Refill:  0    Fill for july   amphetamine-dextroamphetamine (ADDERALL XR) 10 MG 24 hr capsule    Sig: Take 1 capsule (10 mg total) by mouth daily.    Dispense:  30 capsule    Refill:  0    Fill for August   amphetamine-dextroamphetamine (ADDERALL XR) 10 MG 24 hr capsule    Sig: Take 1 capsule (10 mg total) by mouth daily.    Dispense:  30 capsule    Refill:  0    Fill for september   ALPRAZolam (XANAX) 0.25 MG tablet    Sig: Take 1 tablet (0.25 mg total) by mouth as needed for up to 6 doses for anxiety (before a flight).    Dispense:  6 tablet    Refill:  0   escitalopram (LEXAPRO) 5 MG tablet    Sig: Take 1.5 tablets (7.5 mg total) by mouth daily. Then wean to 1 tablet daily at patient discretion.    Dispense:  45 tablet    Refill:  2    Discontinue escitalopram 10 mg tablet, and fill new script today.    Return in about 12 weeks (around 09/16/2023) for F/U, ADHD med check, anxiety med refill, Jenella Craigie PCP.   Total time spent:30 Minutes Time spent includes review of chart, medications, test results, and follow up plan with the patient.   [place] Controlled Substance Database was reviewed by me.  This patient was seen by Sallyanne Kuster, FNP-C in collaboration with Dr. Beverely Risen as a part of collaborative care agreement.   Alizon Schmeling R. Tedd Sias, MSN, FNP-C Internal medicine

## 2023-06-26 ENCOUNTER — Encounter: Payer: Self-pay | Admitting: Nurse Practitioner

## 2023-06-28 ENCOUNTER — Telehealth: Payer: Self-pay

## 2023-06-28 DIAGNOSIS — F40243 Fear of flying: Secondary | ICD-10-CM

## 2023-06-28 MED ORDER — ALPRAZOLAM 0.25 MG PO TABS: 0.2500 mg | ORAL_TABLET | ORAL | 0 refills | Status: AC | PRN

## 2023-06-28 NOTE — Telephone Encounter (Signed)
Called   phar that we add her 4 more tab for xanax as per alyssa its ok to fill med

## 2023-06-28 NOTE — Telephone Encounter (Signed)
Pt notified that we sent med

## 2023-08-17 ENCOUNTER — Telehealth: Payer: Self-pay

## 2023-08-17 DIAGNOSIS — R928 Other abnormal and inconclusive findings on diagnostic imaging of breast: Secondary | ICD-10-CM

## 2023-08-19 NOTE — Telephone Encounter (Signed)
Pt.notified

## 2023-09-15 ENCOUNTER — Ambulatory Visit
Admission: RE | Admit: 2023-09-15 | Discharge: 2023-09-15 | Disposition: A | Discharge status: Home / Self Care | Payer: BC Managed Care – PPO | Source: Ambulatory Visit | Attending: Nurse Practitioner | Admitting: Nurse Practitioner

## 2023-09-15 ENCOUNTER — Ambulatory Visit
Admission: RE | Admit: 2023-09-15 | Discharge: 2023-09-15 | Disposition: A | Discharge status: Home / Self Care | Payer: BC Managed Care – PPO | Source: Ambulatory Visit | Attending: Nurse Practitioner

## 2023-09-15 ENCOUNTER — Encounter: Payer: Self-pay | Admitting: Radiology

## 2023-09-15 DIAGNOSIS — R928 Other abnormal and inconclusive findings on diagnostic imaging of breast: Secondary | ICD-10-CM | POA: Diagnosis present

## 2023-09-21 ENCOUNTER — Encounter: Payer: Self-pay | Admitting: Nurse Practitioner

## 2023-09-21 ENCOUNTER — Ambulatory Visit (INDEPENDENT_AMBULATORY_CARE_PROVIDER_SITE_OTHER): Payer: Managed Care, Other (non HMO) | Admitting: Nurse Practitioner

## 2023-09-21 VITALS — BP 124/88 | HR 91 | Temp 98.3°F | Resp 16 | Ht 65.0 in | Wt 210.0 lb

## 2023-09-21 DIAGNOSIS — E559 Vitamin D deficiency, unspecified: Secondary | ICD-10-CM | POA: Diagnosis not present

## 2023-09-21 DIAGNOSIS — F900 Attention-deficit hyperactivity disorder, predominantly inattentive type: Secondary | ICD-10-CM

## 2023-09-21 DIAGNOSIS — E782 Mixed hyperlipidemia: Secondary | ICD-10-CM | POA: Diagnosis not present

## 2023-09-21 DIAGNOSIS — E538 Deficiency of other specified B group vitamins: Secondary | ICD-10-CM | POA: Diagnosis not present

## 2023-09-21 MED ORDER — AMPHETAMINE-DEXTROAMPHET ER 10 MG PO CP24: 10.0000 mg | ORAL_CAPSULE | Freq: Every day | ORAL | 0 refills | Status: DC

## 2023-09-21 NOTE — Progress Notes (Unsigned)
`` [facility] [address] [place], [place] [address]  Internal MEDICINE  Office Visit Note  Patient Name: Sophia Garrett  [id number]  [id number]  Date of Service: 09/21/2023  Chief Complaint  Patient presents with   Follow-up    HPI [name] presents for a follow-up visit for ADHD, refills and routine labs.  ADHD -- current dose effective, heart rate and BP are normal. Denies palpitations and other adverse side effects of the medications.  Due for routine labs for upcoming -- screen for high cholesterol, vitamin D and B12, etc.     Current Medication: Outpatient Encounter Medications as of 09/21/2023  Medication Sig   ALPRAZolam (XANAX) 0.25 MG tablet Take 1 tablet (0.25 mg total) by mouth as needed for up to 4 doses for anxiety (before a flight).   cetirizine (ZYRTEC) 10 MG tablet Take 10 mg by mouth daily.   escitalopram (LEXAPRO) 5 MG tablet Take 1.5 tablets (7.5 mg total) by mouth daily. Then wean to 1 tablet daily at patient discretion.   triamcinolone (KENALOG) 0.025 % ointment Apply 1 Application topically 2 (two) times daily.   [DISCONTINUED] amphetamine-dextroamphetamine (ADDERALL XR) 10 MG 24 hr capsule Take 1 capsule (10 mg total) by mouth daily.   [DISCONTINUED] amphetamine-dextroamphetamine (ADDERALL XR) 10 MG 24 hr capsule Take 1 capsule (10 mg total) by mouth daily.   [DISCONTINUED] amphetamine-dextroamphetamine (ADDERALL XR) 10 MG 24 hr capsule Take 1 capsule (10 mg total) by mouth daily.   amphetamine-dextroamphetamine (ADDERALL XR) 10 MG 24 hr capsule Take 1 capsule (10 mg total) by mouth daily.   [START ON 10/19/2023] amphetamine-dextroamphetamine (ADDERALL XR) 10 MG 24 hr capsule Take 1 capsule (10 mg total) by mouth daily.   [START ON 11/16/2023] amphetamine-dextroamphetamine (ADDERALL XR) 10 MG 24 hr capsule Take 1 capsule (10 mg total) by mouth daily.   No facility-administered encounter medications on file as of 09/21/2023.    Surgical  History: Past Surgical History:  Procedure Laterality Date   MOLE REMOVAL Right    right side of chest    Medical History: Past Medical History:  Diagnosis Date   Allergy    Anxiety     Family History: Family History  Problem Relation Age of Onset   Hyperlipidemia Mother    Skin cancer Mother        'not melanoma'   Alcohol abuse Father    Heart disease Father    Hypertension Father    Arrhythmia Sister    Breast cancer Maternal Aunt    Cancer Maternal Aunt        Breast Cancer   Breast cancer Paternal Aunt    Cancer Paternal Aunt        Breast Cancer   Cancer Paternal Aunt        Breast Cancer   Breast cancer Maternal Grandmother    Cancer Maternal Grandmother        Breast Cancer   Breast cancer Maternal Grandfather    Cancer Maternal Grandfather        Prostate/Lung Cancer   Breast cancer Paternal Grandmother    Cancer Paternal Grandmother        Breast Cancer   Cancer Paternal Grandfather        Prostate Cancer   Colon cancer Neg Hx     Social History   Socioeconomic History   Marital status: Married    Spouse name: Not on file   Number of children: Not on file   Years of education:  Not on file   Highest education level: Not on file  Occupational History   Not on file  Tobacco Use   Smoking status: Never   Smokeless tobacco: Never  Vaping Use   Vaping status: Never Used  Substance and Sexual Activity   Alcohol use: Yes    Comment: rare   Drug use: No   Sexual activity: Yes    Partners: Male    Birth control/protection: None    Comment: Husband  Other Topics Concern   Not on file  Social History Narrative   Married   Masters Degree   Caffeine- Soda diet Dr. Reino Garrett 3 cans daily, no coffee    Children- 3    Pets: 1 dog     Social Determinants of Health   Financial Resource Strain: Not on file  Food Insecurity: Not on file  Transportation Needs: Not on file  Physical Activity: Not on file  Stress: Not on file  Social Connections:  Not on file  Intimate Partner Violence: Not on file      Review of Systems  Constitutional: Negative.  Negative for fatigue.  Respiratory: Negative.  Negative for cough, chest tightness, shortness of breath and wheezing.   Cardiovascular: Negative.  Negative for chest pain and palpitations.  Gastrointestinal: Negative.   Genitourinary: Negative.   Musculoskeletal: Negative.   Neurological: Negative.   Psychiatric/Behavioral:  Positive for decreased concentration and sleep disturbance. Negative for self-injury and suicidal ideas. The patient is nervous/anxious.     Vital Signs: BP 124/88   Pulse 91   Temp 98.3 F (36.8 C)   Resp 16   Ht 5\' 5"  (1.651 m)   Wt 210 lb (95.3 kg)   LMP 09/05/2023   SpO2 98%   BMI 34.95 kg/m    Physical Exam Vitals reviewed.  Constitutional:      General: She is not in acute distress.    Appearance: Normal appearance. She is obese. She is not ill-appearing.  HENT:     Head: Normocephalic and atraumatic.     Ears:     Comments: Dry scaly rash on external ear right side  Eyes:     Pupils: Pupils are equal, round, and reactive to light.  Cardiovascular:     Rate and Rhythm: Normal rate and regular rhythm.  Pulmonary:     Effort: Pulmonary effort is normal. No respiratory distress.  Neurological:     Mental Status: She is alert and oriented to person, place, and time.  Psychiatric:        Attention and Perception: She is attentive.        Mood and Affect: Mood is not anxious. Affect is not labile.        Speech: Speech is not rapid and pressured.        Behavior: Behavior is not hyperactive.        Thought Content: Thought content is not paranoid. Thought content does not include homicidal or suicidal ideation.        Cognition and Memory: She does not exhibit impaired recent memory.        Judgment: Judgment normal.        Assessment/Plan: 1. Mixed hyperlipidemia Routine labs ordered  - CBC with Differential/Platelet -  CMP14+EGFR - Lipid Profile - TSH + free T4 - B12 and Folate Panel  2. B12 deficiency Routine labs ordered  - CBC with Differential/Platelet - CMP14+EGFR - Lipid Profile - TSH + free T4 - B12 and Folate Panel  3.  Vitamin D deficiency Routine labs ordered  - Vitamin D (25 hydroxy)  4. Attention deficit hyperactivity disorder (ADHD), predominantly inattentive type Continue adderall as prescribed. Follow up in 3 months for additional refills. UDS due with next office visit.  - amphetamine-dextroamphetamine (ADDERALL XR) 10 MG 24 hr capsule; Take 1 capsule (10 mg total) by mouth daily.  Dispense: 30 capsule; Refill: 0 - amphetamine-dextroamphetamine (ADDERALL XR) 10 MG 24 hr capsule; Take 1 capsule (10 mg total) by mouth daily.  Dispense: 30 capsule; Refill: 0 - amphetamine-dextroamphetamine (ADDERALL XR) 10 MG 24 hr capsule; Take 1 capsule (10 mg total) by mouth daily.  Dispense: 30 capsule; Refill: 0   General Counseling: Jazelle verbalizes understanding of the findings of todays visit and agrees with plan of treatment. I have discussed any further diagnostic evaluation that may be needed or ordered today. We also reviewed her medications today. she has been encouraged to call the office with any questions or concerns that should arise related to todays visit.    Orders Placed This Encounter  Procedures   CBC with Differential/Platelet   CMP14+EGFR   Lipid Profile   Vitamin D (25 hydroxy)   TSH + free T4   B12 and Folate Panel    Meds ordered this encounter  Medications   amphetamine-dextroamphetamine (ADDERALL XR) 10 MG 24 hr capsule    Sig: Take 1 capsule (10 mg total) by mouth daily.    Dispense:  30 capsule    Refill:  0    Fill for October   amphetamine-dextroamphetamine (ADDERALL XR) 10 MG 24 hr capsule    Sig: Take 1 capsule (10 mg total) by mouth daily.    Dispense:  30 capsule    Refill:  0    Fill for november   amphetamine-dextroamphetamine (ADDERALL XR) 10 MG  24 hr capsule    Sig: Take 1 capsule (10 mg total) by mouth daily.    Dispense:  30 capsule    Refill:  0    Fill for december    Return in about 3 months (around 12/16/2023) for F/U, ADHD med check, Mitsuo Budnick PCP.   Total time spent:30 Minutes Time spent includes review of chart, medications, test results, and follow up plan with the patient.   [place] Controlled Substance Database was reviewed by me.  This patient was seen by Sallyanne Kuster, FNP-C in collaboration with Dr. Beverely Risen as a part of collaborative care agreement.   Hannan Tetzlaff R. Tedd Sias, MSN, FNP-C Internal medicine

## 2023-09-28 ENCOUNTER — Other Ambulatory Visit: Payer: Self-pay

## 2023-09-28 ENCOUNTER — Telehealth: Payer: Self-pay | Admitting: Nurse Practitioner

## 2023-09-28 NOTE — Telephone Encounter (Signed)
Left vm and sent mychart message to confirm 10/04/23 appointment-Toni

## 2023-10-02 LAB — CBC WITH DIFFERENTIAL/PLATELET
Basophils Absolute: 0 10*3/uL (ref 0.0–0.2)
Basos: 0 %
EOS (ABSOLUTE): 0.1 10*3/uL (ref 0.0–0.4)
Eos: 2 %
Hematocrit: 40.4 % (ref 34.0–46.6)
Hemoglobin: 12.7 g/dL (ref 11.1–15.9)
Immature Grans (Abs): 0 10*3/uL (ref 0.0–0.1)
Immature Granulocytes: 0 %
Lymphocytes Absolute: 1.2 10*3/uL (ref 0.7–3.1)
Lymphs: 17 %
MCH: 28.7 pg (ref 26.6–33.0)
MCHC: 31.4 g/dL — ABNORMAL LOW (ref 31.5–35.7)
MCV: 91 fL (ref 79–97)
Monocytes Absolute: 0.5 10*3/uL (ref 0.1–0.9)
Monocytes: 7 %
Neutrophils Absolute: 5.3 10*3/uL (ref 1.4–7.0)
Neutrophils: 74 %
Platelets: 284 10*3/uL (ref 150–450)
RBC: 4.43 x10E6/uL (ref 3.77–5.28)
RDW: 12.7 % (ref 11.7–15.4)
WBC: 7.1 10*3/uL (ref 3.4–10.8)

## 2023-10-02 LAB — CMP14+EGFR
ALT: 19 [IU]/L (ref 0–32)
AST: 15 [IU]/L (ref 0–40)
Albumin: 4.1 g/dL (ref 3.9–4.9)
Alkaline Phosphatase: 84 [IU]/L (ref 44–121)
BUN/Creatinine Ratio: 12 (ref 9–23)
BUN: 10 mg/dL (ref 6–24)
Bilirubin Total: 0.4 mg/dL (ref 0.0–1.2)
CO2: 20 mmol/L (ref 20–29)
Calcium: 8.7 mg/dL (ref 8.7–10.2)
Chloride: 103 mmol/L (ref 96–106)
Creatinine, Ser: 0.85 mg/dL (ref 0.57–1.00)
Globulin, Total: 2.6 g/dL (ref 1.5–4.5)
Glucose: 98 mg/dL (ref 70–99)
Potassium: 4.4 mmol/L (ref 3.5–5.2)
Sodium: 138 mmol/L (ref 134–144)
Total Protein: 6.7 g/dL (ref 6.0–8.5)
eGFR: 87 mL/min/{1.73_m2} (ref >59)

## 2023-10-02 LAB — B12 AND FOLATE PANEL
Folate: 9.9 ng/mL (ref >3.0)
Vitamin B-12: 318 pg/mL (ref 232–1245)

## 2023-10-02 LAB — LIPID PANEL
Chol/HDL Ratio: 3.5 {ratio} (ref 0.0–4.4)
Cholesterol, Total: 209 mg/dL — ABNORMAL HIGH (ref 100–199)
HDL: 59 mg/dL (ref >39)
LDL Chol Calc (NIH): 107 mg/dL — ABNORMAL HIGH (ref 0–99)
Triglycerides: 251 mg/dL — ABNORMAL HIGH (ref 0–149)
VLDL Cholesterol Cal: 43 mg/dL — ABNORMAL HIGH (ref 5–40)

## 2023-10-02 LAB — TSH+FREE T4
Free T4: 1.04 ng/dL (ref 0.82–1.77)
TSH: 3.71 u[IU]/mL (ref 0.450–4.500)

## 2023-10-02 LAB — VITAMIN D 25 HYDROXY (VIT D DEFICIENCY, FRACTURES): Vit D, 25-Hydroxy: 28.2 ng/mL — ABNORMAL LOW (ref 30.0–100.0)

## 2023-10-04 ENCOUNTER — Encounter: Payer: Self-pay | Admitting: Nurse Practitioner

## 2023-10-04 ENCOUNTER — Ambulatory Visit (INDEPENDENT_AMBULATORY_CARE_PROVIDER_SITE_OTHER): Payer: Managed Care, Other (non HMO) | Admitting: Nurse Practitioner

## 2023-10-04 VITALS — BP 118/76 | HR 96 | Temp 98.4°F | Resp 16 | Ht 65.0 in | Wt 210.0 lb

## 2023-10-04 DIAGNOSIS — F900 Attention-deficit hyperactivity disorder, predominantly inattentive type: Secondary | ICD-10-CM

## 2023-10-04 DIAGNOSIS — E66811 Other obesity due to excess calories: Secondary | ICD-10-CM

## 2023-10-04 DIAGNOSIS — Z6834 Body mass index (BMI) 34.0-34.9, adult: Secondary | ICD-10-CM

## 2023-10-04 DIAGNOSIS — E538 Deficiency of other specified B group vitamins: Secondary | ICD-10-CM

## 2023-10-04 DIAGNOSIS — E6609 Other obesity due to excess calories: Secondary | ICD-10-CM

## 2023-10-04 DIAGNOSIS — Z0001 Encounter for general adult medical examination with abnormal findings: Secondary | ICD-10-CM

## 2023-10-04 DIAGNOSIS — F411 Generalized anxiety disorder: Secondary | ICD-10-CM | POA: Diagnosis not present

## 2023-10-04 NOTE — Progress Notes (Signed)
[facility] [address] [place], [place] [address]  Internal MEDICINE  Office Visit Note  Patient Name: Sophia Garrett  [id number]  [id number]  Date of Service: 10/04/2023  Chief Complaint  Patient presents with   Annual Exam    HPI Sophia Garrett presents for an annual well visit and physical exam.  Well-appearing 42 y.o. female with GAD and multiple allergies.  Routine CRC screening: due next year  Routine mammogram: done in October this year  Pap smear: due in April next year  Labs: due for routine labs  New or worsening pain: none  Bi-rads 3 probably benign, greenish nipple discharge. --recommendation is to complete screening mammogram in 1 year.    Current Medication: Outpatient Encounter Medications as of 10/04/2023  Medication Sig   ALPRAZolam (XANAX) 0.25 MG tablet Take 1 tablet (0.25 mg total) by mouth as needed for up to 4 doses for anxiety (before a flight).   amphetamine-dextroamphetamine (ADDERALL XR) 10 MG 24 hr capsule Take 1 capsule (10 mg total) by mouth daily.   amphetamine-dextroamphetamine (ADDERALL XR) 10 MG 24 hr capsule Take 1 capsule (10 mg total) by mouth daily.   amphetamine-dextroamphetamine (ADDERALL XR) 10 MG 24 hr capsule Take 1 capsule (10 mg total) by mouth daily.   cetirizine (ZYRTEC) 10 MG tablet Take 10 mg by mouth daily.   escitalopram (LEXAPRO) 5 MG tablet Take 1.5 tablets (7.5 mg total) by mouth daily. Then wean to 1 tablet daily at patient discretion.   triamcinolone (KENALOG) 0.025 % ointment Apply 1 Application topically 2 (two) times daily.   [DISCONTINUED] ALPRAZolam (XANAX) 0.25 MG tablet Take 1 tablet (0.25 mg total) by mouth once as needed for up to 1 dose for anxiety (before a flight).   [DISCONTINUED] amphetamine-dextroamphetamine (ADDERALL XR) 5 MG 24 hr capsule Take 1 capsule (5 mg total) by mouth daily.   [DISCONTINUED] escitalopram (LEXAPRO) 10 MG tablet TAKE 1 AND 1/2 TABLETS BY MOUTH DAILY   No facility-administered  encounter medications on file as of 10/04/2023.    Surgical History: Past Surgical History:  Procedure Laterality Date   MOLE REMOVAL Right    right side of chest    Medical History: Past Medical History:  Diagnosis Date   Allergy    Anxiety     Family History: Family History  Problem Relation Age of Onset   Hyperlipidemia Mother    Skin cancer Mother        'not melanoma'   Alcohol abuse Father    Heart disease Father    Hypertension Father    Arrhythmia Sister    Breast cancer Maternal Aunt    Cancer Maternal Aunt        Breast Cancer   Breast cancer Paternal Aunt    Cancer Paternal Aunt        Breast Cancer   Cancer Paternal Aunt        Breast Cancer   Breast cancer Maternal Grandmother    Cancer Maternal Grandmother        Breast Cancer   Breast cancer Maternal Grandfather    Cancer Maternal Grandfather        Prostate/Lung Cancer   Breast cancer Paternal Grandmother    Cancer Paternal Grandmother        Breast Cancer   Cancer Paternal Grandfather        Prostate Cancer   Colon cancer Neg Hx     Social History   Socioeconomic History   Marital status: Married  Spouse name: Not on file   Number of children: Not on file   Years of education: Not on file   Highest education level: Not on file  Occupational History   Not on file  Tobacco Use   Smoking status: Never   Smokeless tobacco: Never  Vaping Use   Vaping status: Never Used  Substance and Sexual Activity   Alcohol use: Yes    Comment: rare   Drug use: No   Sexual activity: Yes    Partners: Male    Birth control/protection: None    Comment: Husband  Other Topics Concern   Not on file  Social History Narrative   Married   Masters Degree   Caffeine- Soda diet Dr. Reino Garrett 3 cans daily, no coffee    Children- 3    Pets: 1 dog     Social Determinants of Health   Financial Resource Strain: Not on file  Food Insecurity: Not on file  Transportation Needs: Not on file  Physical  Activity: Not on file  Stress: Not on file  Social Connections: Not on file  Intimate Partner Violence: Not on file      Review of Systems  Constitutional:  Negative for activity change, appetite change, chills, fatigue, fever and unexpected weight change.  HENT: Negative.  Negative for congestion, ear pain, rhinorrhea, sore throat and trouble swallowing.   Eyes: Negative.   Respiratory: Negative.  Negative for cough, chest tightness, shortness of breath and wheezing.   Cardiovascular: Negative.  Negative for chest pain and palpitations.  Gastrointestinal: Negative.  Negative for abdominal pain, blood in stool, constipation, diarrhea, nausea and vomiting.  Endocrine: Negative.   Genitourinary: Negative.  Negative for difficulty urinating, dysuria, frequency, hematuria and urgency.  Musculoskeletal: Negative.  Negative for arthralgias, back pain, joint swelling, myalgias and neck pain.  Skin: Negative.  Negative for rash and wound.  Allergic/Immunologic: Negative.  Negative for immunocompromised state.  Neurological: Negative.  Negative for dizziness, seizures, numbness and headaches.  Hematological: Negative.   Psychiatric/Behavioral: Negative.  Negative for behavioral problems, self-injury and suicidal ideas. The patient is not nervous/anxious.     Vital Signs: BP 118/76   Pulse 96   Temp 98.4 F (36.9 C)   Resp 16   Ht 5\' 5"  (1.651 m)   Wt 210 lb (95.3 kg)   LMP 09/05/2023   SpO2 98%   BMI 34.95 kg/m    Physical Exam Vitals reviewed.  Constitutional:      General: She is awake. She is not in acute distress.    Appearance: Normal appearance. She is well-developed and well-groomed. She is obese. She is not ill-appearing or diaphoretic.  HENT:     Head: Normocephalic and atraumatic.     Right Ear: Tympanic membrane, ear canal and external ear normal.     Left Ear: Tympanic membrane, ear canal and external ear normal.     Nose: Nose normal. No congestion or rhinorrhea.      Mouth/Throat:     Lips: Pink.     Mouth: Mucous membranes are moist.     Pharynx: Oropharynx is clear. Uvula midline. No oropharyngeal exudate or posterior oropharyngeal erythema.  Eyes:     General: Lids are normal. Vision grossly intact. Gaze aligned appropriately. No scleral icterus.       Right eye: No discharge.        Left eye: No discharge.     Extraocular Movements: Extraocular movements intact.     Conjunctiva/sclera: Conjunctivae normal.  Pupils: Pupils are equal, round, and reactive to light.     Funduscopic exam:    Right eye: Red reflex present.        Left eye: Red reflex present. Neck:     Thyroid: No thyromegaly.     Vascular: No carotid bruit or JVD.     Trachea: Trachea and phonation normal. No tracheal deviation.  Cardiovascular:     Rate and Rhythm: Normal rate and regular rhythm.     Pulses: Normal pulses.     Heart sounds: Normal heart sounds, S1 normal and S2 normal. No murmur heard.    No friction rub. No gallop.  Pulmonary:     Effort: Pulmonary effort is normal. No accessory muscle usage or respiratory distress.     Breath sounds: Normal breath sounds and air entry. No stridor. No wheezing or rales.  Chest:     Chest wall: No tenderness.     Comments: Declined clinical breast exam, gets yearly mammograms. Abdominal:     General: Bowel sounds are normal. There is no distension.     Palpations: Abdomen is soft. There is no shifting dullness, fluid wave, mass or pulsatile mass.     Tenderness: There is no abdominal tenderness. There is no guarding or rebound.  Musculoskeletal:        General: No tenderness or deformity. Normal range of motion.     Cervical back: Normal range of motion and neck supple.     Right lower leg: No edema.     Left lower leg: No edema.  Lymphadenopathy:     Cervical: No cervical adenopathy.  Skin:    General: Skin is warm and dry.     Capillary Refill: Capillary refill takes less than 2 seconds.     Coloration: Skin  is not pale.     Findings: No erythema or rash.  Neurological:     Mental Status: She is alert and oriented to person, place, and time.     Cranial Nerves: No cranial nerve deficit.     Motor: No abnormal muscle tone.     Coordination: Coordination normal.     Gait: Gait normal.     Deep Tendon Reflexes: Reflexes are normal and symmetric.  Psychiatric:        Mood and Affect: Mood and affect normal.        Behavior: Behavior normal. Behavior is cooperative.        Thought Content: Thought content normal.        Judgment: Judgment normal.        Assessment/Plan: 1. Encounter for routine adult health examination with abnormal findings Age-appropriate preventive screenings and vaccinations discussed, annual physical exam completed. Routine labs for health maintenance will be ordered and requisition form given to patient.Marland Kitchen PHM updated.    2. B12 deficiency May continue OTC B12 supplement 1000 mcg daily. Will also repeat B12 lab.   3. Class 1 obesity due to excess calories without serious comorbidity with body mass index (BMI) of 34.0 to 34.9 in adult Continue diet and lifestyle modifications as discussed. Patient does not want to take medication for weight loss at this time. She is working on this on her own and at her own pace per her wishes.   4. Generalized anxiety disorder Continue lexapro as prescribed.   5. Attention deficit hyperactivity disorder (ADHD), predominantly inattentive type Continue adderall as prescribed, follow up for refills is scheduled for early January      General Counseling: Tehillah verbalizes  understanding of the findings of todays visit and agrees with plan of treatment. I have discussed any further diagnostic evaluation that may be needed or ordered today. We also reviewed her medications today. she has been encouraged to call the office with any questions or concerns that should arise related to todays visit.    No orders of the defined types were  placed in this encounter.   No orders of the defined types were placed in this encounter.   Return in about 2 months (around 12/16/2023) for F/U, ADHD med check, Torres Hardenbrook PCP, anxiety,  also need monthly nurse visit for B12, to start next week.   Total time spent:30 Minutes Time spent includes review of chart, medications, test results, and follow up plan with the patient.   [place] Controlled Substance Database was reviewed by me.  This patient was seen by Sallyanne Kuster, FNP-C in collaboration with Dr. Beverely Risen as a part of collaborative care agreement.  Rodrick Payson R. Tedd Sias, MSN, FNP-C Internal medicine

## 2023-10-11 ENCOUNTER — Ambulatory Visit (INDEPENDENT_AMBULATORY_CARE_PROVIDER_SITE_OTHER): Payer: Managed Care, Other (non HMO)

## 2023-10-11 DIAGNOSIS — E538 Deficiency of other specified B group vitamins: Secondary | ICD-10-CM | POA: Diagnosis not present

## 2023-10-11 MED ORDER — CYANOCOBALAMIN 1000 MCG/ML IJ SOLN
1000.0000 ug | Freq: Once | INTRAMUSCULAR | Status: AC
  Administered 2023-10-11: 1000 ug via INTRAMUSCULAR

## 2023-10-18 ENCOUNTER — Ambulatory Visit (INDEPENDENT_AMBULATORY_CARE_PROVIDER_SITE_OTHER): Payer: Managed Care, Other (non HMO)

## 2023-10-18 DIAGNOSIS — E538 Deficiency of other specified B group vitamins: Secondary | ICD-10-CM

## 2023-10-18 MED ORDER — CYANOCOBALAMIN 1000 MCG/ML IJ SOLN
1000.0000 ug | Freq: Once | INTRAMUSCULAR | Status: AC
  Administered 2023-10-18: 1000 ug via INTRAMUSCULAR

## 2023-10-25 ENCOUNTER — Ambulatory Visit: Payer: Managed Care, Other (non HMO)

## 2023-11-01 ENCOUNTER — Ambulatory Visit (INDEPENDENT_AMBULATORY_CARE_PROVIDER_SITE_OTHER): Payer: Managed Care, Other (non HMO)

## 2023-11-01 DIAGNOSIS — E538 Deficiency of other specified B group vitamins: Secondary | ICD-10-CM

## 2023-11-01 MED ORDER — CYANOCOBALAMIN 1000 MCG/ML IJ SOLN
1000.0000 ug | Freq: Once | INTRAMUSCULAR | Status: AC
  Administered 2023-11-01: 1000 ug via INTRAMUSCULAR

## 2023-11-07 ENCOUNTER — Encounter: Payer: Self-pay | Admitting: Nurse Practitioner

## 2023-11-20 ENCOUNTER — Encounter: Payer: Self-pay | Admitting: Nurse Practitioner

## 2023-11-20 DIAGNOSIS — E661 Drug-induced obesity: Secondary | ICD-10-CM | POA: Insufficient documentation

## 2023-11-20 DIAGNOSIS — E538 Deficiency of other specified B group vitamins: Secondary | ICD-10-CM | POA: Insufficient documentation

## 2023-11-22 ENCOUNTER — Ambulatory Visit (INDEPENDENT_AMBULATORY_CARE_PROVIDER_SITE_OTHER): Payer: Managed Care, Other (non HMO)

## 2023-11-22 DIAGNOSIS — E538 Deficiency of other specified B group vitamins: Secondary | ICD-10-CM

## 2023-11-22 MED ORDER — CYANOCOBALAMIN 1000 MCG/ML IJ SOLN
1000.0000 ug | Freq: Once | INTRAMUSCULAR | Status: AC
  Administered 2023-11-22: 1000 ug via INTRAMUSCULAR

## 2023-12-16 ENCOUNTER — Ambulatory Visit (INDEPENDENT_AMBULATORY_CARE_PROVIDER_SITE_OTHER): Payer: Managed Care, Other (non HMO) | Admitting: Nurse Practitioner

## 2023-12-16 ENCOUNTER — Encounter: Payer: Self-pay | Admitting: Nurse Practitioner

## 2023-12-16 VITALS — BP 120/86 | HR 97 | Temp 97.9°F | Resp 16 | Ht 65.0 in | Wt 214.4 lb

## 2023-12-16 DIAGNOSIS — F900 Attention-deficit hyperactivity disorder, predominantly inattentive type: Secondary | ICD-10-CM | POA: Diagnosis not present

## 2023-12-16 DIAGNOSIS — K644 Residual hemorrhoidal skin tags: Secondary | ICD-10-CM

## 2023-12-16 DIAGNOSIS — F411 Generalized anxiety disorder: Secondary | ICD-10-CM

## 2023-12-16 MED ORDER — AMPHETAMINE-DEXTROAMPHET ER 5 MG PO CP24: 5.0000 mg | ORAL_CAPSULE | Freq: Every day | ORAL | 0 refills | Status: DC

## 2023-12-16 MED ORDER — HYDROCORTISONE ACETATE 25 MG RE SUPP: 25.0000 mg | Freq: Two times a day (BID) | RECTAL | 5 refills | Status: DC

## 2023-12-16 MED ORDER — ESCITALOPRAM OXALATE 5 MG PO TABS: 7.5000 mg | ORAL_TABLET | Freq: Every day | ORAL | 2 refills | Status: DC

## 2023-12-16 NOTE — Progress Notes (Signed)
 [facility] [address] [place], [place] [address]  Internal MEDICINE  Office Visit Note  Patient Name: Sophia Garrett  [id number]  [id number]  Date of Service: 12/16/2023  Chief Complaint  Patient presents with   Follow-up    HPI Sophia Garrett presents for a follow-up visit for ADHD, anxiety and hemorrhoids.  ADHD -- stopped taking adderall around October or November. Restarted taking adderall recently but the 10 mg dose seems to be too much and wants to decrease back to 5 mg dose.   Update medication list with vitamins --  taking a multivitamin now.  Start taking a probiotic due to having nervous tummy ache External hemorrhoids --  Tkaign supplement for menopause symptoms that has black cherry extract, ashwaganda and black cohosh in it.    Current Medication: Outpatient Encounter Medications as of 12/16/2023  Medication Sig   ALPRAZolam  (XANAX ) 0.25 MG tablet Take 1 tablet (0.25 mg total) by mouth as needed for up to 4 doses for anxiety (before a flight).   amphetamine -dextroamphetamine (ADDERALL XR) 5 MG 24 hr capsule Take 1 capsule (5 mg total) by mouth daily.   [START ON 01/13/2024] amphetamine -dextroamphetamine (ADDERALL XR) 5 MG 24 hr capsule Take 1 capsule (5 mg total) by mouth daily.   [START ON 02/10/2024] amphetamine -dextroamphetamine (ADDERALL XR) 5 MG 24 hr capsule Take 1 capsule (5 mg total) by mouth daily.   cetirizine (ZYRTEC) 10 MG tablet Take 10 mg by mouth daily.   hydrocortisone  (ANUSOL -HC) 25 MG suppository Place 1 suppository (25 mg total) rectally 2 (two) times daily.   triamcinolone  (KENALOG ) 0.025 % ointment Apply 1 Application topically 2 (two) times daily.   [DISCONTINUED] amphetamine -dextroamphetamine (ADDERALL XR) 10 MG 24 hr capsule Take 1 capsule (10 mg total) by mouth daily.   [DISCONTINUED] amphetamine -dextroamphetamine (ADDERALL XR) 10 MG 24 hr capsule Take 1 capsule (10 mg total) by mouth daily.   [DISCONTINUED] amphetamine -dextroamphetamine  (ADDERALL XR) 10 MG 24 hr capsule Take 1 capsule (10 mg total) by mouth daily.   [DISCONTINUED] escitalopram  (LEXAPRO ) 5 MG tablet Take 1.5 tablets (7.5 mg total) by mouth daily. Then wean to 1 tablet daily at patient discretion.   escitalopram  (LEXAPRO ) 5 MG tablet Take 1.5 tablets (7.5 mg total) by mouth daily. Then wean to 1 tablet daily at patient discretion.   No facility-administered encounter medications on file as of 12/16/2023.    Surgical History: Past Surgical History:  Procedure Laterality Date   MOLE REMOVAL Right    right side of chest    Medical History: Past Medical History:  Diagnosis Date   Allergy    Anxiety     Family History: Family History  Problem Relation Age of Onset   Hyperlipidemia Mother    Skin cancer Mother        'not melanoma'   Alcohol abuse Father    Heart disease Father    Hypertension Father    Arrhythmia Sister    Breast cancer Maternal Aunt    Cancer Maternal Aunt        Breast Cancer   Breast cancer Paternal Aunt    Cancer Paternal Aunt        Breast Cancer   Cancer Paternal Aunt        Breast Cancer   Breast cancer Maternal Grandmother    Cancer Maternal Grandmother        Breast Cancer   Breast cancer Maternal Grandfather    Cancer Maternal Grandfather  Prostate/Lung Cancer   Breast cancer Paternal Grandmother    Cancer Paternal Grandmother        Breast Cancer   Cancer Paternal Grandfather        Prostate Cancer   Colon cancer Neg Hx     Social History   Socioeconomic History   Marital status: Married    Spouse name: Not on file   Number of children: Not on file   Years of education: Not on file   Highest education level: Not on file  Occupational History   Not on file  Tobacco Use   Smoking status: Never   Smokeless tobacco: Never  Vaping Use   Vaping status: Never Used  Substance and Sexual Activity   Alcohol use: Yes    Comment: rare   Drug use: No   Sexual activity: Yes    Partners: Male     Birth control/protection: None    Comment: Husband  Other Topics Concern   Not on file  Social History Narrative   Married   Masters Degree   Caffeine- Soda diet Dr. Nunzio 3 cans daily, no coffee    Children- 3    Pets: 1 dog     Social Drivers of Corporate Investment Banker Strain: Not on file  Food Insecurity: Not on file  Transportation Needs: Not on file  Physical Activity: Not on file  Stress: Not on file  Social Connections: Not on file  Intimate Partner Violence: Not on file      Review of Systems  Constitutional: Negative.  Negative for fatigue.  Respiratory: Negative.  Negative for cough, chest tightness, shortness of breath and wheezing.   Cardiovascular: Negative.  Negative for chest pain and palpitations.  Gastrointestinal:  Positive for anal bleeding (from external hemorrhoid) and rectal pain.  Genitourinary: Negative.   Musculoskeletal: Negative.   Neurological: Negative.   Psychiatric/Behavioral:  Positive for decreased concentration and sleep disturbance. Negative for self-injury and suicidal ideas. The patient is nervous/anxious.     Vital Signs: BP 120/86   Pulse 97   Temp 97.9 F (36.6 C)   Resp 16   Ht 5' 5 (1.651 m)   Wt 214 lb 6.4 oz (97.3 kg)   SpO2 97%   BMI 35.68 kg/m    Physical Exam Vitals reviewed.  Constitutional:      General: She is not in acute distress.    Appearance: Normal appearance. She is obese. She is not ill-appearing.  HENT:     Head: Normocephalic and atraumatic.     Ears:     Comments: Dry scaly rash on external ear right side  Eyes:     Pupils: Pupils are equal, round, and reactive to light.  Cardiovascular:     Rate and Rhythm: Normal rate and regular rhythm.  Pulmonary:     Effort: Pulmonary effort is normal. No respiratory distress.  Neurological:     Mental Status: She is alert and oriented to person, place, and time.  Psychiatric:        Attention and Perception: She is attentive.        Mood and  Affect: Mood is not anxious. Affect is not labile.        Speech: Speech is not rapid and pressured.        Behavior: Behavior is not hyperactive.        Thought Content: Thought content is not paranoid. Thought content does not include homicidal or suicidal ideation.  Cognition and Memory: She does not exhibit impaired recent memory.        Judgment: Judgment normal.        Assessment/Plan: 1. Hemorrhoids, external (Primary) Discussed OTC medications she can try and anusol  suppository prescribed as well, use as needed.  - hydrocortisone  (ANUSOL -HC) 25 MG suppository; Place 1 suppository (25 mg total) rectally 2 (two) times daily.  Dispense: 12 suppository; Refill: 5  2. Attention deficit hyperactivity disorder (ADHD), predominantly inattentive type Decreased adderall xr dose to 5 mg daily. Take as prescribed. Follow up in 3 months for additional refills.  - amphetamine -dextroamphetamine (ADDERALL XR) 5 MG 24 hr capsule; Take 1 capsule (5 mg total) by mouth daily.  Dispense: 30 capsule; Refill: 0 - amphetamine -dextroamphetamine (ADDERALL XR) 5 MG 24 hr capsule; Take 1 capsule (5 mg total) by mouth daily.  Dispense: 30 capsule; Refill: 0 - amphetamine -dextroamphetamine (ADDERALL XR) 5 MG 24 hr capsule; Take 1 capsule (5 mg total) by mouth daily.  Dispense: 30 capsule; Refill: 0  3. Generalized anxiety disorder Continue lexapro  at 7.5 mg daily, alternating with 5 mg dose every other day if this is more tolerable.  - escitalopram  (LEXAPRO ) 5 MG tablet; Take 1.5 tablets (7.5 mg total) by mouth daily. Then wean to 1 tablet daily at patient discretion.  Dispense: 45 tablet; Refill: 2   General Counseling: Sophia Garrett verbalizes understanding of the findings of todays visit and agrees with plan of treatment. I have discussed any further diagnostic evaluation that may be needed or ordered today. We also reviewed her medications today. she has been encouraged to call the office with any questions  or concerns that should arise related to todays visit.    No orders of the defined types were placed in this encounter.   Meds ordered this encounter  Medications   escitalopram  (LEXAPRO ) 5 MG tablet    Sig: Take 1.5 tablets (7.5 mg total) by mouth daily. Then wean to 1 tablet daily at patient discretion.    Dispense:  45 tablet    Refill:  2    Fill new script today   amphetamine -dextroamphetamine (ADDERALL XR) 5 MG 24 hr capsule    Sig: Take 1 capsule (5 mg total) by mouth daily.    Dispense:  30 capsule    Refill:  0    Note decreased dose, fill new script today, fill for January.   amphetamine -dextroamphetamine (ADDERALL XR) 5 MG 24 hr capsule    Sig: Take 1 capsule (5 mg total) by mouth daily.    Dispense:  30 capsule    Refill:  0    fill for January 30   amphetamine -dextroamphetamine (ADDERALL XR) 5 MG 24 hr capsule    Sig: Take 1 capsule (5 mg total) by mouth daily.    Dispense:  30 capsule    Refill:  0    fill for February 27   hydrocortisone  (ANUSOL -HC) 25 MG suppository    Sig: Place 1 suppository (25 mg total) rectally 2 (two) times daily.    Dispense:  12 suppository    Refill:  5    Fill new script today    Return in about 3 months (around 03/08/2024) for F/U, ADHD med check, Sophia Garrett PCP.   Total time spent:30 Minutes Time spent includes review of chart, medications, test results, and follow up plan with the patient.   [place] Controlled Substance Database was reviewed by me.  This patient was seen by Mardy Maxin, FNP-C in collaboration with Dr. Sigrid Bathe  as a part of collaborative care agreement.   Bram Hottel R. Liana, MSN, FNP-C Internal medicine

## 2023-12-27 ENCOUNTER — Ambulatory Visit: Payer: Managed Care, Other (non HMO)

## 2024-01-24 ENCOUNTER — Ambulatory Visit (INDEPENDENT_AMBULATORY_CARE_PROVIDER_SITE_OTHER): Payer: Managed Care, Other (non HMO)

## 2024-01-24 DIAGNOSIS — E538 Deficiency of other specified B group vitamins: Secondary | ICD-10-CM | POA: Diagnosis not present

## 2024-01-24 MED ORDER — CYANOCOBALAMIN 1000 MCG/ML IJ SOLN
1000.0000 ug | Freq: Once | INTRAMUSCULAR | Status: AC
  Administered 2024-01-24: 1000 ug via INTRAMUSCULAR

## 2024-02-21 ENCOUNTER — Ambulatory Visit (INDEPENDENT_AMBULATORY_CARE_PROVIDER_SITE_OTHER): Payer: Managed Care, Other (non HMO)

## 2024-02-21 DIAGNOSIS — E538 Deficiency of other specified B group vitamins: Secondary | ICD-10-CM | POA: Diagnosis not present

## 2024-02-21 MED ORDER — CYANOCOBALAMIN 1000 MCG/ML IJ SOLN
1000.0000 ug | Freq: Once | INTRAMUSCULAR | Status: AC
  Administered 2024-02-21: 1000 ug via INTRAMUSCULAR

## 2024-03-09 ENCOUNTER — Ambulatory Visit: Payer: Managed Care, Other (non HMO) | Admitting: Nurse Practitioner

## 2024-03-24 ENCOUNTER — Encounter: Payer: Self-pay | Admitting: Nurse Practitioner

## 2024-03-24 ENCOUNTER — Ambulatory Visit: Admitting: Nurse Practitioner

## 2024-03-24 ENCOUNTER — Ambulatory Visit

## 2024-03-24 VITALS — BP 126/80 | HR 100 | Temp 98.4°F | Resp 16 | Ht 65.0 in | Wt 214.0 lb

## 2024-03-24 DIAGNOSIS — F900 Attention-deficit hyperactivity disorder, predominantly inattentive type: Secondary | ICD-10-CM | POA: Diagnosis not present

## 2024-03-24 DIAGNOSIS — F411 Generalized anxiety disorder: Secondary | ICD-10-CM

## 2024-03-24 DIAGNOSIS — E538 Deficiency of other specified B group vitamins: Secondary | ICD-10-CM | POA: Diagnosis not present

## 2024-03-24 MED ORDER — AMPHETAMINE-DEXTROAMPHET ER 5 MG PO CP24: 5.0000 mg | ORAL_CAPSULE | Freq: Every day | ORAL | 0 refills | Status: DC

## 2024-03-24 MED ORDER — CYANOCOBALAMIN 1000 MCG/ML IJ SOLN
1000.0000 ug | Freq: Once | INTRAMUSCULAR | Status: AC
  Administered 2024-03-24: 1000 ug via INTRAMUSCULAR

## 2024-03-24 MED ORDER — ESCITALOPRAM OXALATE 5 MG PO TABS: 7.5000 mg | ORAL_TABLET | Freq: Every day | ORAL | 2 refills | Status: DC

## 2024-03-24 NOTE — Progress Notes (Signed)
 [facility] [address] [place], [place] [address]  Internal MEDICINE  Office Visit Note  Patient Name: Sophia Garrett  [id number]  [id number]  Date of Service: 03/24/2024  Chief Complaint  Patient presents with   Follow-up    HPI Sophia Garrett presents for a follow-up visit for B12 deficiency, anxiety and ADHD.  B12 deficiency -- due for B12 injection today ADHD -- current dose remains effective. BP and heart rate are normal. Denies any palpitations or other adverse side effects of the medication.  Anxiety -- lexapro dose weaned down to 5 mg daily now, doing well with this dose.     Current Medication: Outpatient Encounter Medications as of 03/24/2024  Medication Sig   ALPRAZolam (XANAX) 0.25 MG tablet Take 1 tablet (0.25 mg total) by mouth as needed for up to 4 doses for anxiety (before a flight).   cetirizine (ZYRTEC) 10 MG tablet Take 10 mg by mouth daily.   hydrocortisone (ANUSOL-HC) 25 MG suppository Place 1 suppository (25 mg total) rectally 2 (two) times daily.   triamcinolone (KENALOG) 0.025 % ointment Apply 1 Application topically 2 (two) times daily.   [DISCONTINUED] amphetamine-dextroamphetamine (ADDERALL XR) 5 MG 24 hr capsule Take 1 capsule (5 mg total) by mouth daily.   [DISCONTINUED] amphetamine-dextroamphetamine (ADDERALL XR) 5 MG 24 hr capsule Take 1 capsule (5 mg total) by mouth daily.   [DISCONTINUED] amphetamine-dextroamphetamine (ADDERALL XR) 5 MG 24 hr capsule Take 1 capsule (5 mg total) by mouth daily.   [DISCONTINUED] escitalopram (LEXAPRO) 5 MG tablet Take 1.5 tablets (7.5 mg total) by mouth daily. Then wean to 1 tablet daily at patient discretion.   [START ON 05/19/2024] amphetamine-dextroamphetamine (ADDERALL XR) 5 MG 24 hr capsule Take 1 capsule (5 mg total) by mouth daily.   [START ON 04/21/2024] amphetamine-dextroamphetamine (ADDERALL XR) 5 MG 24 hr capsule Take 1 capsule (5 mg total) by mouth daily.   amphetamine-dextroamphetamine (ADDERALL XR) 5  MG 24 hr capsule Take 1 capsule (5 mg total) by mouth daily.   escitalopram (LEXAPRO) 5 MG tablet Take 1.5 tablets (7.5 mg total) by mouth daily. Then wean to 1 tablet daily at patient discretion.   [EXPIRED] cyanocobalamin (VITAMIN B12) injection 1,000 mcg    No facility-administered encounter medications on file as of 03/24/2024.    Surgical History: Past Surgical History:  Procedure Laterality Date   MOLE REMOVAL Right    right side of chest    Medical History: Past Medical History:  Diagnosis Date   Allergy    Anxiety     Family History: Family History  Problem Relation Age of Onset   Hyperlipidemia Mother    Skin cancer Mother        'not melanoma'   Alcohol abuse Father    Heart disease Father    Hypertension Father    Arrhythmia Sister    Breast cancer Maternal Aunt    Cancer Maternal Aunt        Breast Cancer   Breast cancer Paternal Aunt    Cancer Paternal Aunt        Breast Cancer   Cancer Paternal Aunt        Breast Cancer   Breast cancer Maternal Grandmother    Cancer Maternal Grandmother        Breast Cancer   Breast cancer Maternal Grandfather    Cancer Maternal Grandfather        Prostate/Lung Cancer   Breast cancer Paternal Grandmother    Cancer Paternal Grandmother  Breast Cancer   Cancer Paternal Grandfather        Prostate Cancer   Colon cancer Neg Hx     Social History   Socioeconomic History   Marital status: Married    Spouse name: Not on file   Number of children: Not on file   Years of education: Not on file   Highest education level: Not on file  Occupational History   Not on file  Tobacco Use   Smoking status: Never   Smokeless tobacco: Never  Vaping Use   Vaping status: Never Used  Substance and Sexual Activity   Alcohol use: Yes    Comment: rare   Drug use: No   Sexual activity: Yes    Partners: Male    Birth control/protection: None    Comment: Husband  Other Topics Concern   Not on file  Social History  Narrative   Married   Masters Degree   Caffeine- Soda diet Dr. Reino Kent 3 cans daily, no coffee    Children- 3    Pets: 1 dog     Social Drivers of Corporate investment banker Strain: Not on file  Food Insecurity: Not on file  Transportation Needs: Not on file  Physical Activity: Not on file  Stress: Not on file  Social Connections: Not on file  Intimate Partner Violence: Not on file      Review of Systems  Constitutional: Negative.  Negative for fatigue.  Respiratory: Negative.  Negative for cough, chest tightness, shortness of breath and wheezing.   Cardiovascular: Negative.  Negative for chest pain and palpitations.  Gastrointestinal: Negative.   Genitourinary: Negative.   Musculoskeletal: Negative.   Neurological: Negative.   Psychiatric/Behavioral:  Positive for decreased concentration and sleep disturbance. Negative for self-injury and suicidal ideas. The patient is nervous/anxious.     Vital Signs: BP 126/80   Pulse 100   Temp 98.4 F (36.9 C)   Resp 16   Ht 5\' 5"  (1.651 m)   Wt 214 lb (97.1 kg)   SpO2 98%   BMI 35.61 kg/m    Physical Exam Vitals reviewed.  Constitutional:      General: She is not in acute distress.    Appearance: Normal appearance. She is obese. She is not ill-appearing.  HENT:     Head: Normocephalic and atraumatic.  Eyes:     Pupils: Pupils are equal, round, and reactive to light.  Cardiovascular:     Rate and Rhythm: Normal rate and regular rhythm.  Pulmonary:     Effort: Pulmonary effort is normal. No respiratory distress.  Neurological:     Mental Status: She is alert and oriented to person, place, and time.  Psychiatric:        Attention and Perception: She is attentive.        Mood and Affect: Mood is not anxious. Affect is not labile.        Speech: Speech is not rapid and pressured.        Behavior: Behavior is not hyperactive.        Thought Content: Thought content is not paranoid. Thought content does not include  homicidal or suicidal ideation.        Cognition and Memory: She does not exhibit impaired recent memory.        Judgment: Judgment normal.        Assessment/Plan: 1. B12 deficiency (Primary) B12 injection administered in office today - cyanocobalamin (VITAMIN B12) injection 1,000 mcg  2.  Attention deficit hyperactivity disorder (ADHD), predominantly inattentive type Continue adderall as prescribed. Follow up in 3 months for additional refills, UDS due at next visit.  - amphetamine-dextroamphetamine (ADDERALL XR) 5 MG 24 hr capsule; Take 1 capsule (5 mg total) by mouth daily.  Dispense: 30 capsule; Refill: 0 - amphetamine-dextroamphetamine (ADDERALL XR) 5 MG 24 hr capsule; Take 1 capsule (5 mg total) by mouth daily.  Dispense: 30 capsule; Refill: 0 - amphetamine-dextroamphetamine (ADDERALL XR) 5 MG 24 hr capsule; Take 1 capsule (5 mg total) by mouth daily.  Dispense: 30 capsule; Refill: 0  3. Generalized anxiety disorder Continue lexapro 5 mg daily as prescribed.  - escitalopram (LEXAPRO) 5 MG tablet; Take 1.5 tablets (7.5 mg total) by mouth daily. Then wean to 1 tablet daily at patient discretion.  Dispense: 30 tablet; Refill: 2   General Counseling: Sophia Garrett verbalizes understanding of the findings of todays visit and agrees with plan of treatment. I have discussed any further diagnostic evaluation that may be needed or ordered today. We also reviewed her medications today. she has been encouraged to call the office with any questions or concerns that should arise related to todays visit.    No orders of the defined types were placed in this encounter.   Meds ordered this encounter  Medications   cyanocobalamin (VITAMIN B12) injection 1,000 mcg   amphetamine-dextroamphetamine (ADDERALL XR) 5 MG 24 hr capsule    Sig: Take 1 capsule (5 mg total) by mouth daily.    Dispense:  30 capsule    Refill:  0    fill for june   amphetamine-dextroamphetamine (ADDERALL XR) 5 MG 24 hr  capsule    Sig: Take 1 capsule (5 mg total) by mouth daily.    Dispense:  30 capsule    Refill:  0    fill for may   amphetamine-dextroamphetamine (ADDERALL XR) 5 MG 24 hr capsule    Sig: Take 1 capsule (5 mg total) by mouth daily.    Dispense:  30 capsule    Refill:  0    Fill script today, fill for april   escitalopram (LEXAPRO) 5 MG tablet    Sig: Take 1.5 tablets (7.5 mg total) by mouth daily. Then wean to 1 tablet daily at patient discretion.    Dispense:  30 tablet    Refill:  2    Fill new script today    Return in about 3 months (around 06/14/2024) for F/U, ADHD med check, Sophia Garrett PCP, UDS due at next visit .   Total time spent:30 Minutes Time spent includes review of chart, medications, test results, and follow up plan with the patient.   [place] Controlled Substance Database was reviewed by me.  This patient was seen by Sallyanne Kuster, FNP-C in collaboration with Dr. Beverely Risen as a part of collaborative care agreement.   Carmell Elgin R. Tedd Sias, MSN, FNP-C Internal medicine

## 2024-03-24 NOTE — Addendum Note (Signed)
 Addended by: Sallyanne Kuster on: 03/24/2024 11:38 AM   Modules accepted: Orders

## 2024-03-27 ENCOUNTER — Ambulatory Visit: Payer: Managed Care, Other (non HMO)

## 2024-04-21 ENCOUNTER — Ambulatory Visit (INDEPENDENT_AMBULATORY_CARE_PROVIDER_SITE_OTHER)

## 2024-04-21 DIAGNOSIS — E538 Deficiency of other specified B group vitamins: Secondary | ICD-10-CM | POA: Diagnosis not present

## 2024-04-21 MED ORDER — CYANOCOBALAMIN 1000 MCG/ML IJ SOLN
1000.0000 ug | Freq: Once | INTRAMUSCULAR | Status: AC
  Administered 2024-04-21: 1000 ug via INTRAMUSCULAR

## 2024-05-03 ENCOUNTER — Other Ambulatory Visit: Payer: Self-pay | Admitting: Nurse Practitioner

## 2024-05-03 DIAGNOSIS — F411 Generalized anxiety disorder: Secondary | ICD-10-CM

## 2024-05-19 ENCOUNTER — Ambulatory Visit

## 2024-05-19 DIAGNOSIS — E538 Deficiency of other specified B group vitamins: Secondary | ICD-10-CM

## 2024-05-19 MED ORDER — CYANOCOBALAMIN 1000 MCG/ML IJ SOLN
1000.0000 ug | Freq: Once | INTRAMUSCULAR | Status: AC
  Administered 2024-05-19: 1000 ug via INTRAMUSCULAR

## 2024-06-17 ENCOUNTER — Other Ambulatory Visit: Payer: Self-pay | Admitting: Nurse Practitioner

## 2024-06-17 DIAGNOSIS — F411 Generalized anxiety disorder: Secondary | ICD-10-CM

## 2024-06-19 ENCOUNTER — Ambulatory Visit (INDEPENDENT_AMBULATORY_CARE_PROVIDER_SITE_OTHER)

## 2024-06-19 DIAGNOSIS — E538 Deficiency of other specified B group vitamins: Secondary | ICD-10-CM

## 2024-06-19 MED ORDER — CYANOCOBALAMIN 1000 MCG/ML IJ SOLN
1000.0000 ug | Freq: Once | INTRAMUSCULAR | Status: AC
  Administered 2024-06-19: 1000 ug via INTRAMUSCULAR

## 2024-06-23 ENCOUNTER — Encounter: Payer: Self-pay | Admitting: Nurse Practitioner

## 2024-06-23 ENCOUNTER — Other Ambulatory Visit: Payer: Self-pay | Admitting: Nurse Practitioner

## 2024-06-23 ENCOUNTER — Ambulatory Visit: Admitting: Nurse Practitioner

## 2024-06-23 VITALS — BP 126/84 | HR 95 | Temp 98.1°F | Resp 16 | Ht 65.0 in | Wt 210.2 lb

## 2024-06-23 DIAGNOSIS — Z79899 Other long term (current) drug therapy: Secondary | ICD-10-CM

## 2024-06-23 DIAGNOSIS — E66811 Obesity, class 1: Secondary | ICD-10-CM | POA: Diagnosis not present

## 2024-06-23 DIAGNOSIS — Z6834 Body mass index (BMI) 34.0-34.9, adult: Secondary | ICD-10-CM

## 2024-06-23 DIAGNOSIS — E6609 Other obesity due to excess calories: Secondary | ICD-10-CM

## 2024-06-23 DIAGNOSIS — K644 Residual hemorrhoidal skin tags: Secondary | ICD-10-CM | POA: Diagnosis not present

## 2024-06-23 DIAGNOSIS — F900 Attention-deficit hyperactivity disorder, predominantly inattentive type: Secondary | ICD-10-CM

## 2024-06-23 DIAGNOSIS — F411 Generalized anxiety disorder: Secondary | ICD-10-CM

## 2024-06-23 LAB — POCT URINE DRUG SCREEN
Methylenedioxyamphetamine: NOT DETECTED
POC Amphetamine UR: POSITIVE — AB
POC BENZODIAZEPINES UR: NOT DETECTED
POC Barbiturate UR: NOT DETECTED
POC Cocaine UR: NOT DETECTED
POC Ecstasy UR: NOT DETECTED
POC Marijuana UR: NOT DETECTED
POC Methadone UR: NOT DETECTED
POC Methamphetamine UR: NOT DETECTED
POC Opiate Ur: NOT DETECTED
POC Oxycodone UR: NOT DETECTED
POC PHENCYCLIDINE UR: NOT DETECTED
POC TRICYCLICS UR: NOT DETECTED

## 2024-06-23 MED ORDER — ZEPBOUND 5 MG/0.5ML ~~LOC~~ SOAJ: 5.0000 mg | SUBCUTANEOUS | 5 refills | Status: DC

## 2024-06-23 MED ORDER — AMPHETAMINE-DEXTROAMPHET ER 5 MG PO CP24: 5.0000 mg | ORAL_CAPSULE | Freq: Every day | ORAL | 0 refills | Status: DC

## 2024-06-23 MED ORDER — HYDROCORTISONE ACETATE 25 MG RE SUPP: 25.0000 mg | Freq: Two times a day (BID) | RECTAL | 5 refills | Status: AC

## 2024-06-23 MED ORDER — ESCITALOPRAM OXALATE 5 MG PO TABS: 7.5000 mg | ORAL_TABLET | Freq: Every day | ORAL | 2 refills | Status: DC

## 2024-06-23 NOTE — Progress Notes (Signed)
 [facility] [address] [place], [place] [address]  Internal MEDICINE  Office Visit Note  Patient Name: Sophia Garrett  [id number]  [id number]  Date of Service: 06/23/2024  Chief Complaint  Patient presents with  . Follow-up    HPI Sophia Garrett presents for a follow-up visit for anxiety, hemorrhoids, and ADHD and refills.  ADHD -- current dose is effective. BP and heart rate are normal. Denies any palpitations or other adverse side effects. Due for refills  Hemorrhoids -- uses the cortisone suppository as needed  Anxiety -- taking escitalopram  7.5 mg daily.     Current Medication: Outpatient Encounter Medications as of 06/23/2024  Medication Sig  . tirzepatide  (ZEPBOUND ) 5 MG/0.5ML Pen Inject 5 mg into the skin once a week.  . ALPRAZolam  (XANAX ) 0.25 MG tablet Take 1 tablet (0.25 mg total) by mouth as needed for up to 4 doses for anxiety (before a flight).  . amphetamine -dextroamphetamine (ADDERALL XR) 5 MG 24 hr capsule Take 1 capsule (5 mg total) by mouth daily.  SABRA CASTLEMAN ON 07/21/2024] amphetamine -dextroamphetamine (ADDERALL XR) 5 MG 24 hr capsule Take 1 capsule (5 mg total) by mouth daily.  SABRA CASTLEMAN ON 08/18/2024] amphetamine -dextroamphetamine (ADDERALL XR) 5 MG 24 hr capsule Take 1 capsule (5 mg total) by mouth daily.  . cetirizine (ZYRTEC) 10 MG tablet Take 10 mg by mouth daily.  . escitalopram  (LEXAPRO ) 5 MG tablet Take 1.5 tablets (7.5 mg total) by mouth daily. Then wean to 1 tablet daily at patient discretion.  . hydrocortisone  (ANUSOL -HC) 25 MG suppository Place 1 suppository (25 mg total) rectally 2 (two) times daily.  . triamcinolone  (KENALOG ) 0.025 % ointment Apply 1 Application topically 2 (two) times daily.  . [DISCONTINUED] amphetamine -dextroamphetamine (ADDERALL XR) 5 MG 24 hr capsule Take 1 capsule (5 mg total) by mouth daily.  . [DISCONTINUED] amphetamine -dextroamphetamine (ADDERALL XR) 5 MG 24 hr capsule Take 1 capsule (5 mg total) by mouth daily.  .  [DISCONTINUED] amphetamine -dextroamphetamine (ADDERALL XR) 5 MG 24 hr capsule Take 1 capsule (5 mg total) by mouth daily.  . [DISCONTINUED] escitalopram  (LEXAPRO ) 5 MG tablet TAKE 1.5 TABLETS (7.5 MG TOTAL) BY MOUTH DAILY. THEN WEAN TO 1 TABLET DAILY AT PATIENT DISCRETION.  . [DISCONTINUED] hydrocortisone  (ANUSOL -HC) 25 MG suppository Place 1 suppository (25 mg total) rectally 2 (two) times daily.   No facility-administered encounter medications on file as of 06/23/2024.    Surgical History: Past Surgical History:  Procedure Laterality Date  . MOLE REMOVAL Right    right side of chest    Medical History: Past Medical History:  Diagnosis Date  . Allergy   . Anxiety     Family History: Family History  Problem Relation Age of Onset  . Hyperlipidemia Mother   . Skin cancer Mother        'not melanoma'  . Alcohol abuse Father   . Heart disease Father   . Hypertension Father   . Arrhythmia Sister   . Breast cancer Maternal Aunt   . Cancer Maternal Aunt        Breast Cancer  . Breast cancer Paternal Aunt   . Cancer Paternal Aunt        Breast Cancer  . Cancer Paternal Aunt        Breast Cancer  . Breast cancer Maternal Grandmother   . Cancer Maternal Grandmother        Breast Cancer  . Breast cancer Maternal Grandfather   . Cancer Maternal Grandfather  Prostate/Lung Cancer  . Breast cancer Paternal Grandmother   . Cancer Paternal Grandmother        Breast Cancer  . Cancer Paternal Grandfather        Prostate Cancer  . Colon cancer Neg Hx     Social History   Socioeconomic History  . Marital status: Married    Spouse name: Not on file  . Number of children: Not on file  . Years of education: Not on file  . Highest education level: Not on file  Occupational History  . Not on file  Tobacco Use  . Smoking status: Never  . Smokeless tobacco: Never  Vaping Use  . Vaping status: Never Used  Substance and Sexual Activity  . Alcohol use: Yes    Comment: rare   . Drug use: No  . Sexual activity: Yes    Partners: Male    Birth control/protection: None    Comment: Husband  Other Topics Concern  . Not on file  Social History Narrative   Married   Masters Degree   Caffeine- Soda diet Dr. Nunzio 3 cans daily, no coffee    Children- 3    Pets: 1 dog     Social Drivers of Corporate investment banker Strain: Not on file  Food Insecurity: Not on file  Transportation Needs: Not on file  Physical Activity: Not on file  Stress: Not on file  Social Connections: Not on file  Intimate Partner Violence: Not on file      Review of Systems  Constitutional: Negative.  Negative for fatigue.  Respiratory: Negative.  Negative for cough, chest tightness, shortness of breath and wheezing.   Cardiovascular: Negative.  Negative for chest pain and palpitations.  Gastrointestinal: Negative.   Genitourinary: Negative.   Musculoskeletal: Negative.   Neurological: Negative.   Psychiatric/Behavioral:  Positive for decreased concentration and sleep disturbance. Negative for self-injury and suicidal ideas. The patient is nervous/anxious.     Vital Signs: BP 126/84   Pulse 95   Temp 98.1 F (36.7 C)   Resp 16   Ht 5' 5 (1.651 m)   Wt 210 lb 3.2 oz (95.3 kg)   SpO2 96%   BMI 34.98 kg/m    Physical Exam Vitals reviewed.  Constitutional:      General: She is not in acute distress.    Appearance: Normal appearance. She is obese. She is not ill-appearing.  HENT:     Head: Normocephalic and atraumatic.  Eyes:     Pupils: Pupils are equal, round, and reactive to light.  Cardiovascular:     Rate and Rhythm: Normal rate and regular rhythm.  Pulmonary:     Effort: Pulmonary effort is normal. No respiratory distress.  Neurological:     Mental Status: She is alert and oriented to person, place, and time.  Psychiatric:        Attention and Perception: She is attentive.        Mood and Affect: Mood is not anxious. Affect is not labile.        Speech:  Speech is not rapid and pressured.        Behavior: Behavior is not hyperactive.        Thought Content: Thought content is not paranoid. Thought content does not include homicidal or suicidal ideation.        Cognition and Memory: She does not exhibit impaired recent memory.        Judgment: Judgment normal.  Assessment/Plan: 1. Hemorrhoids, external (Primary) *** - hydrocortisone  (ANUSOL -HC) 25 MG suppository; Place 1 suppository (25 mg total) rectally 2 (two) times daily.  Dispense: 12 suppository; Refill: 5  2. Class 1 obesity due to excess calories without serious comorbidity with body mass index (BMI) of 34.0 to 34.9 in adult *** - tirzepatide  (ZEPBOUND ) 5 MG/0.5ML Pen; Inject 5 mg into the skin once a week.  Dispense: 2 mL; Refill: 5  3. Encounter for long-term (current) use of medications *** - POCT Urine Drug Screen  4. Attention deficit hyperactivity disorder (ADHD), predominantly inattentive type *** - amphetamine -dextroamphetamine (ADDERALL XR) 5 MG 24 hr capsule; Take 1 capsule (5 mg total) by mouth daily.  Dispense: 30 capsule; Refill: 0 - amphetamine -dextroamphetamine (ADDERALL XR) 5 MG 24 hr capsule; Take 1 capsule (5 mg total) by mouth daily.  Dispense: 30 capsule; Refill: 0 - amphetamine -dextroamphetamine (ADDERALL XR) 5 MG 24 hr capsule; Take 1 capsule (5 mg total) by mouth daily.  Dispense: 30 capsule; Refill: 0  5. Generalized anxiety disorder *** - escitalopram  (LEXAPRO ) 5 MG tablet; Take 1.5 tablets (7.5 mg total) by mouth daily. Then wean to 1 tablet daily at patient discretion.  Dispense: 45 tablet; Refill: 2   General Counseling: Sophia Garrett verbalizes understanding of the findings of todays visit and agrees with plan of treatment. I have discussed any further diagnostic evaluation that may be needed or ordered today. We also reviewed her medications today. she has been encouraged to call the office with any questions or concerns that should arise related  to todays visit.    Orders Placed This Encounter  Procedures  . POCT Urine Drug Screen    Meds ordered this encounter  Medications  . amphetamine -dextroamphetamine (ADDERALL XR) 5 MG 24 hr capsule    Sig: Take 1 capsule (5 mg total) by mouth daily.    Dispense:  30 capsule    Refill:  0    fill for july  . amphetamine -dextroamphetamine (ADDERALL XR) 5 MG 24 hr capsule    Sig: Take 1 capsule (5 mg total) by mouth daily.    Dispense:  30 capsule    Refill:  0    Fill for August  . amphetamine -dextroamphetamine (ADDERALL XR) 5 MG 24 hr capsule    Sig: Take 1 capsule (5 mg total) by mouth daily.    Dispense:  30 capsule    Refill:  0    fill for september  . escitalopram  (LEXAPRO ) 5 MG tablet    Sig: Take 1.5 tablets (7.5 mg total) by mouth daily. Then wean to 1 tablet daily at patient discretion.    Dispense:  45 tablet    Refill:  2    Keep on file for future  . hydrocortisone  (ANUSOL -HC) 25 MG suppository    Sig: Place 1 suppository (25 mg total) rectally 2 (two) times daily.    Dispense:  12 suppository    Refill:  5    Fill new script today  . tirzepatide  (ZEPBOUND ) 5 MG/0.5ML Pen    Sig: Inject 5 mg into the skin once a week.    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   Total time spent:30 Minutes Time spent includes review of chart, medications, test results, and follow up plan with the patient.   [place] Controlled Substance Database was reviewed by me.  This  patient was seen by Mardy Maxin, FNP-C in collaboration with Dr. Sigrid Bathe as a part of collaborative care agreement.   Kamela Blansett R. Maxin, MSN, FNP-C Internal medicine

## 2024-06-25 ENCOUNTER — Encounter: Payer: Self-pay | Admitting: Nurse Practitioner

## 2024-07-26 ENCOUNTER — Other Ambulatory Visit: Payer: Self-pay | Admitting: Nurse Practitioner

## 2024-07-26 DIAGNOSIS — E6609 Other obesity due to excess calories: Secondary | ICD-10-CM

## 2024-07-26 DIAGNOSIS — E66811 Obesity, class 1: Secondary | ICD-10-CM

## 2024-07-26 MED ORDER — ZEPBOUND 5 MG/0.5ML ~~LOC~~ SOLN: 5.0000 mg | SUBCUTANEOUS | 5 refills | Status: DC

## 2024-09-15 ENCOUNTER — Ambulatory Visit: Payer: Managed Care, Other (non HMO) | Admitting: Nurse Practitioner

## 2024-10-02 ENCOUNTER — Telehealth: Payer: Self-pay | Admitting: Nurse Practitioner

## 2024-10-02 NOTE — Telephone Encounter (Signed)
 Left vm and sent mychart message to confirm 10/09/24 appointment-Toni

## 2024-10-09 ENCOUNTER — Ambulatory Visit (INDEPENDENT_AMBULATORY_CARE_PROVIDER_SITE_OTHER): Payer: Managed Care, Other (non HMO) | Admitting: Nurse Practitioner

## 2024-10-09 ENCOUNTER — Encounter: Payer: Self-pay | Admitting: Nurse Practitioner

## 2024-10-09 VITALS — BP 116/80 | HR 96 | Temp 96.6°F | Resp 16 | Ht 65.0 in | Wt 208.8 lb

## 2024-10-09 DIAGNOSIS — N951 Menopausal and female climacteric states: Secondary | ICD-10-CM

## 2024-10-09 DIAGNOSIS — Z1231 Encounter for screening mammogram for malignant neoplasm of breast: Secondary | ICD-10-CM

## 2024-10-09 DIAGNOSIS — E559 Vitamin D deficiency, unspecified: Secondary | ICD-10-CM | POA: Diagnosis not present

## 2024-10-09 DIAGNOSIS — Z1211 Encounter for screening for malignant neoplasm of colon: Secondary | ICD-10-CM

## 2024-10-09 DIAGNOSIS — Z0001 Encounter for general adult medical examination with abnormal findings: Secondary | ICD-10-CM | POA: Diagnosis not present

## 2024-10-09 DIAGNOSIS — E782 Mixed hyperlipidemia: Secondary | ICD-10-CM | POA: Diagnosis not present

## 2024-10-09 DIAGNOSIS — E538 Deficiency of other specified B group vitamins: Secondary | ICD-10-CM | POA: Diagnosis not present

## 2024-10-09 DIAGNOSIS — F411 Generalized anxiety disorder: Secondary | ICD-10-CM

## 2024-10-09 DIAGNOSIS — E66811 Obesity, class 1: Secondary | ICD-10-CM

## 2024-10-09 DIAGNOSIS — Z6834 Body mass index (BMI) 34.0-34.9, adult: Secondary | ICD-10-CM

## 2024-10-09 DIAGNOSIS — E6609 Other obesity due to excess calories: Secondary | ICD-10-CM

## 2024-10-09 DIAGNOSIS — F900 Attention-deficit hyperactivity disorder, predominantly inattentive type: Secondary | ICD-10-CM

## 2024-10-09 DIAGNOSIS — Z1212 Encounter for screening for malignant neoplasm of rectum: Secondary | ICD-10-CM

## 2024-10-09 MED ORDER — ESCITALOPRAM OXALATE 10 MG PO TABS: 10.0000 mg | ORAL_TABLET | Freq: Every day | ORAL | 1 refills | Status: AC

## 2024-10-09 MED ORDER — ZEPBOUND 2.5 MG/0.5ML ~~LOC~~ SOLN: 2.5000 mg | SUBCUTANEOUS | 1 refills | Status: AC

## 2024-10-09 MED ORDER — AMPHETAMINE-DEXTROAMPHET ER 5 MG PO CP24
5.0000 mg | ORAL_CAPSULE | Freq: Every day | ORAL | 0 refills | Status: DC
Start: 2024-10-09 — End: 2024-11-06

## 2024-10-09 NOTE — Progress Notes (Signed)
 [facility] [address] [place], [place] [address]  Internal MEDICINE  Office Visit Note  Patient Name: Sophia Garrett  [id number]  [id number]  Date of Service: 10/09/2024  Chief Complaint  Patient presents with   Annual Exam    HPI Sophia Garrett presents for an annual well visit and physical exam.  Well-appearing 43 y.o. female with GAD and multiple allergies.  Routine CRC screening: due now, opt for cologuard, no family history of colon cancer.  Routine mammogram: due now  Pap smear: due now  Labs: due for routine labs  New or worsening pain: none  Other concerns:none  Issues with GI upset and frequent loose stools.    Current Medication: Outpatient Encounter Medications as of 10/09/2024  Medication Sig   escitalopram  (LEXAPRO ) 10 MG tablet Take 1 tablet (10 mg total) by mouth daily.   tirzepatide  (ZEPBOUND ) 2.5 MG/0.5ML injection vial Inject 2.5 mg into the skin once a week.   ALPRAZolam  (XANAX ) 0.25 MG tablet Take 1 tablet (0.25 mg total) by mouth as needed for up to 4 doses for anxiety (before a flight).   amphetamine -dextroamphetamine (ADDERALL XR) 5 MG 24 hr capsule Take 1 capsule (5 mg total) by mouth daily.   amphetamine -dextroamphetamine (ADDERALL XR) 5 MG 24 hr capsule Take 1 capsule (5 mg total) by mouth daily.   amphetamine -dextroamphetamine (ADDERALL XR) 5 MG 24 hr capsule Take 1 capsule (5 mg total) by mouth daily.   cetirizine (ZYRTEC) 10 MG tablet Take 10 mg by mouth daily.   hydrocortisone  (ANUSOL -HC) 25 MG suppository Place 1 suppository (25 mg total) rectally 2 (two) times daily.   triamcinolone  (KENALOG ) 0.025 % ointment Apply 1 Application topically 2 (two) times daily.   [DISCONTINUED] amphetamine -dextroamphetamine (ADDERALL XR) 5 MG 24 hr capsule Take 1 capsule (5 mg total) by mouth daily.   [DISCONTINUED] escitalopram  (LEXAPRO ) 5 MG tablet Take 1.5 tablets (7.5 mg total) by mouth daily. Then wean to 1 tablet daily at patient discretion.    [DISCONTINUED] tirzepatide  (ZEPBOUND ) 5 MG/0.5ML injection vial Inject 5 mg into the skin once a week.   No facility-administered encounter medications on file as of 10/09/2024.    Surgical History: Past Surgical History:  Procedure Laterality Date   MOLE REMOVAL Right    right side of chest    Medical History: Past Medical History:  Diagnosis Date   Allergy    Anxiety     Family History: Family History  Problem Relation Age of Onset   Hyperlipidemia Mother    Skin cancer Mother        'not melanoma'   Alcohol abuse Father    Heart disease Father    Hypertension Father    Arrhythmia Sister    Breast cancer Maternal Aunt    Cancer Maternal Aunt        Breast Cancer   Breast cancer Paternal Aunt    Cancer Paternal Aunt        Breast Cancer   Cancer Paternal Aunt        Breast Cancer   Breast cancer Maternal Grandmother    Cancer Maternal Grandmother        Breast Cancer   Breast cancer Maternal Grandfather    Cancer Maternal Grandfather        Prostate/Lung Cancer   Breast cancer Paternal Grandmother    Cancer Paternal Grandmother        Breast Cancer   Cancer Paternal Grandfather        Prostate Cancer  Colon cancer Neg Hx     Social History   Socioeconomic History   Marital status: Married    Spouse name: Not on file   Number of children: Not on file   Years of education: Not on file   Highest education level: Not on file  Occupational History   Not on file  Tobacco Use   Smoking status: Never   Smokeless tobacco: Never  Vaping Use   Vaping status: Never Used  Substance and Sexual Activity   Alcohol use: Yes    Comment: rare   Drug use: No   Sexual activity: Yes    Partners: Male    Birth control/protection: None    Comment: Husband  Other Topics Concern   Not on file  Social History Narrative   Married   Masters Degree   Caffeine- Soda diet Dr. Nunzio 3 cans daily, no coffee    Children- 3    Pets: 1 dog     Social Drivers of Manufacturing Engineer Strain: Not on file  Food Insecurity: Not on file  Transportation Needs: Not on file  Physical Activity: Not on file  Stress: Not on file  Social Connections: Not on file  Intimate Partner Violence: Not on file      Review of Systems  Constitutional: Negative.  Negative for fatigue.  Respiratory: Negative.  Negative for cough, chest tightness, shortness of breath and wheezing.   Cardiovascular: Negative.  Negative for chest pain and palpitations.  Gastrointestinal: Negative.   Genitourinary: Negative.   Musculoskeletal: Negative.   Neurological: Negative.   Psychiatric/Behavioral:  Positive for decreased concentration and sleep disturbance. Negative for self-injury and suicidal ideas. The patient is nervous/anxious.     Vital Signs: BP 116/80   Pulse 96   Temp (!) 96.6 F (35.9 C)   Resp 16   Ht 5' 5 (1.651 m)   Wt 208 lb 12.8 oz (94.7 kg)   SpO2 99%   BMI 34.75 kg/m    Physical Exam Vitals reviewed.  Constitutional:      General: She is not in acute distress.    Appearance: Normal appearance. She is obese. She is not ill-appearing.  HENT:     Head: Normocephalic and atraumatic.  Eyes:     Pupils: Pupils are equal, round, and reactive to light.  Cardiovascular:     Rate and Rhythm: Normal rate and regular rhythm.  Pulmonary:     Effort: Pulmonary effort is normal. No respiratory distress.  Neurological:     Mental Status: She is alert and oriented to person, place, and time.  Psychiatric:        Attention and Perception: She is attentive.        Mood and Affect: Mood is not anxious. Affect is not labile.        Speech: Speech is not rapid and pressured.        Behavior: Behavior is not hyperactive.        Thought Content: Thought content is not paranoid. Thought content does not include homicidal or suicidal ideation.        Cognition and Memory: She does not exhibit impaired recent memory.        Judgment: Judgment normal.         Assessment/Plan: 1. Encounter for routine adult health examination with abnormal findings (Primary) Age-appropriate preventive screenings and vaccinations discussed, annual physical exam completed. Routine labs for health maintenance ordered, see below. PHM updated.   -  CBC with Differential/Platelet - CMP14+EGFR - Lipid Profile - Vitamin D  (25 hydroxy) - B12 and Folate Panel - Estradiol - FSH/LH - Testosterone,Free and Total  2. Mixed hyperlipidemia Routine labs ordered  - CBC with Differential/Platelet - CMP14+EGFR - Lipid Profile  3. Perimenopause Routine labs ordered  - Estradiol - FSH/LH - Testosterone,Free and Total  4. B12 deficiency Routine lab ordered  - B12 and Folate Panel  5. Vitamin D  deficiency Routine lab ordered  - Vitamin D  (25 hydroxy)  6. Class 1 obesity due to excess calories without serious comorbidity with body mass index (BMI) of 34.0 to 34.9 in adult Continue zepbound  as prescribed.  - tirzepatide  (ZEPBOUND ) 2.5 MG/0.5ML injection vial; Inject 2.5 mg into the skin once a week.  Dispense: 2 mL; Refill: 1  7. Encounter for screening mammogram for malignant neoplasm of breast Routine mammogram ordered - MM 3D SCREENING MAMMOGRAM BILATERAL BREAST; Future  8. Screening for colorectal cancer Routine cologuard test ordered  - Cologuard  9. Generalized anxiety disorder Continue lexapro  as prescribed.  - escitalopram  (LEXAPRO ) 10 MG tablet; Take 1 tablet (10 mg total) by mouth daily.  Dispense: 90 tablet; Refill: 1  10. Attention deficit hyperactivity disorder (ADHD), predominantly inattentive type Continue adderall as prescribed. - amphetamine -dextroamphetamine (ADDERALL XR) 5 MG 24 hr capsule; Take 1 capsule (5 mg total) by mouth daily.  Dispense: 30 capsule; Refill: 0     General Counseling: Sophia Garrett verbalizes understanding of the findings of todays visit and agrees with plan of treatment. I have discussed any further diagnostic  evaluation that may be needed or ordered today. We also reviewed her medications today. she has been encouraged to call the office with any questions or concerns that should arise related to todays visit.    Orders Placed This Encounter  Procedures   MM 3D SCREENING MAMMOGRAM BILATERAL BREAST   CBC with Differential/Platelet   CMP14+EGFR   Lipid Profile   Vitamin D  (25 hydroxy)   B12 and Folate Panel   Estradiol   FSH/LH   Testosterone,Free and Total   Cologuard    Meds ordered this encounter  Medications   amphetamine -dextroamphetamine (ADDERALL XR) 5 MG 24 hr capsule    Sig: Take 1 capsule (5 mg total) by mouth daily.    Dispense:  30 capsule    Refill:  0    Fill for today   escitalopram  (LEXAPRO ) 10 MG tablet    Sig: Take 1 tablet (10 mg total) by mouth daily.    Dispense:  90 tablet    Refill:  1    Note increased dose, fill new script today.   tirzepatide  (ZEPBOUND ) 2.5 MG/0.5ML injection vial    Sig: Inject 2.5 mg into the skin once a week.    Dispense:  2 mL    Refill:  1    Fill new script today    Return in about 3 weeks (around 10/30/2024) for F/U, Labs, Teri Diltz PCP.   Total time spent:30 Minutes Time spent includes review of chart, medications, test results, and follow up plan with the patient.   [place] Controlled Substance Database was reviewed by me.  This patient was seen by Mardy Maxin, FNP-C in collaboration with Dr. Sigrid Bathe as a part of collaborative care agreement.  Marqueta Pulley R. Maxin, MSN, FNP-C Internal medicine

## 2024-10-29 ENCOUNTER — Encounter: Payer: Self-pay | Admitting: Nurse Practitioner

## 2024-10-30 ENCOUNTER — Ambulatory Visit: Admitting: Nurse Practitioner

## 2024-10-31 ENCOUNTER — Ambulatory Visit: Payer: Self-pay | Admitting: Nurse Practitioner

## 2024-10-31 LAB — CBC WITH DIFFERENTIAL/PLATELET
Basophils Absolute: 0 x10E3/uL (ref 0.0–0.2)
Basos: 1 %
EOS (ABSOLUTE): 0.2 x10E3/uL (ref 0.0–0.4)
Eos: 2 %
Hematocrit: 38.9 % (ref 34.0–46.6)
Hemoglobin: 12.8 g/dL (ref 11.1–15.9)
Immature Grans (Abs): 0 x10E3/uL (ref 0.0–0.1)
Immature Granulocytes: 0 %
Lymphocytes Absolute: 1.5 x10E3/uL (ref 0.7–3.1)
Lymphs: 20 %
MCH: 28.5 pg (ref 26.6–33.0)
MCHC: 32.9 g/dL (ref 31.5–35.7)
MCV: 87 fL (ref 79–97)
Monocytes Absolute: 0.4 x10E3/uL (ref 0.1–0.9)
Monocytes: 5 %
Neutrophils Absolute: 5.2 x10E3/uL (ref 1.4–7.0)
Neutrophils: 72 %
Platelets: 337 x10E3/uL (ref 150–450)
RBC: 4.49 x10E6/uL (ref 3.77–5.28)
RDW: 12.6 % (ref 11.7–15.4)
WBC: 7.3 x10E3/uL (ref 3.4–10.8)

## 2024-10-31 LAB — CMP14+EGFR
ALT: 16 IU/L (ref 0–32)
AST: 9 IU/L (ref 0–40)
Albumin: 4.2 g/dL (ref 3.9–4.9)
Alkaline Phosphatase: 100 IU/L (ref 41–116)
BUN/Creatinine Ratio: 9 (ref 9–23)
BUN: 8 mg/dL (ref 6–24)
Bilirubin Total: 0.4 mg/dL (ref 0.0–1.2)
CO2: 21 mmol/L (ref 20–29)
Calcium: 9 mg/dL (ref 8.7–10.2)
Chloride: 105 mmol/L (ref 96–106)
Creatinine, Ser: 0.93 mg/dL (ref 0.57–1.00)
Globulin, Total: 2.8 g/dL (ref 1.5–4.5)
Glucose: 95 mg/dL (ref 70–99)
Potassium: 4.2 mmol/L (ref 3.5–5.2)
Sodium: 140 mmol/L (ref 134–144)
Total Protein: 7 g/dL (ref 6.0–8.5)
eGFR: 77 mL/min/1.73 (ref >59)

## 2024-10-31 LAB — LIPID PANEL
Chol/HDL Ratio: 3.2 ratio (ref 0.0–4.4)
Cholesterol, Total: 229 mg/dL — ABNORMAL HIGH (ref 100–199)
HDL: 71 mg/dL (ref >39)
LDL Chol Calc (NIH): 112 mg/dL — ABNORMAL HIGH (ref 0–99)
Triglycerides: 270 mg/dL — ABNORMAL HIGH (ref 0–149)
VLDL Cholesterol Cal: 46 mg/dL — ABNORMAL HIGH (ref 5–40)

## 2024-10-31 LAB — FSH/LH
FSH: 4 m[IU]/mL
LH: 10.9 m[IU]/mL

## 2024-10-31 LAB — B12 AND FOLATE PANEL
Folate: 3.9 ng/mL (ref >3.0)
Vitamin B-12: 417 pg/mL (ref 232–1245)

## 2024-10-31 LAB — VITAMIN D 25 HYDROXY (VIT D DEFICIENCY, FRACTURES): Vit D, 25-Hydroxy: 24.3 ng/mL — ABNORMAL LOW (ref 30.0–100.0)

## 2024-10-31 LAB — TESTOSTERONE,FREE AND TOTAL
Testosterone, Free: 1 pg/mL (ref 0.0–4.2)
Testosterone: 20 ng/dL (ref 4–50)

## 2024-10-31 LAB — ESTRADIOL: Estradiol: 339 pg/mL

## 2024-10-31 NOTE — Progress Notes (Signed)
 Lab results reviewed, we will discuss the results in detail at her upcoming office visit next Monday 11/06/24

## 2024-11-01 ENCOUNTER — Telehealth: Payer: Self-pay

## 2024-11-06 ENCOUNTER — Encounter: Payer: Self-pay | Admitting: Nurse Practitioner

## 2024-11-06 ENCOUNTER — Ambulatory Visit: Admitting: Physician Assistant

## 2024-11-06 VITALS — BP 122/88 | HR 80 | Temp 96.8°F | Resp 16 | Ht 65.0 in | Wt 209.4 lb

## 2024-11-06 DIAGNOSIS — E6609 Other obesity due to excess calories: Secondary | ICD-10-CM

## 2024-11-06 DIAGNOSIS — Z6834 Body mass index (BMI) 34.0-34.9, adult: Secondary | ICD-10-CM

## 2024-11-06 DIAGNOSIS — E66811 Obesity, class 1: Secondary | ICD-10-CM | POA: Diagnosis not present

## 2024-11-06 DIAGNOSIS — F900 Attention-deficit hyperactivity disorder, predominantly inattentive type: Secondary | ICD-10-CM

## 2024-11-06 DIAGNOSIS — E782 Mixed hyperlipidemia: Secondary | ICD-10-CM | POA: Diagnosis not present

## 2024-11-06 DIAGNOSIS — E559 Vitamin D deficiency, unspecified: Secondary | ICD-10-CM | POA: Diagnosis not present

## 2024-11-06 MED ORDER — AMPHETAMINE-DEXTROAMPHET ER 10 MG PO CP24: 10.0000 mg | ORAL_CAPSULE | Freq: Every day | ORAL | 0 refills | Status: DC

## 2024-11-06 NOTE — Telephone Encounter (Signed)
 Pt had appt today.

## 2024-11-06 NOTE — Progress Notes (Signed)
 [facility] [address] [place], [place] [address]  Internal MEDICINE  Office Visit Note  Patient Name: Sophia Garrett  [id number]  [id number]  Date of Service: 11/06/2024  Chief Complaint  Patient presents with   Follow-up    Review labs    HPI Pt is here for lab review -planning to retry zepbound , previously had stomach upset on this, but will restart soon -Labs show: elevated LDL similar to last year and plans to try zepbound  to help weight loss and help this as well. -Vit D low and will start Vit D supplement. May supplement B12 orally as well -Had been on a trial of taking 2 caps of 5mg  XR adderall in AM and is working well. Needs the 10mg  XR adderall called in now. No side effects with this. Helping focus and actually thinks it lowered her anxiety some as well -will need pap smear next visit -has not done cologuard yet, lost box and will call to get new one. If unable then she is open to colonoscopy  Current Medication: Outpatient Encounter Medications as of 11/06/2024  Medication Sig   amphetamine -dextroamphetamine (ADDERALL XR) 10 MG 24 hr capsule Take 1 capsule (10 mg total) by mouth daily.   ALPRAZolam  (XANAX ) 0.25 MG tablet Take 1 tablet (0.25 mg total) by mouth as needed for up to 4 doses for anxiety (before a flight).   cetirizine (ZYRTEC) 10 MG tablet Take 10 mg by mouth daily.   escitalopram  (LEXAPRO ) 10 MG tablet Take 1 tablet (10 mg total) by mouth daily.   hydrocortisone  (ANUSOL -HC) 25 MG suppository Place 1 suppository (25 mg total) rectally 2 (two) times daily.   tirzepatide  (ZEPBOUND ) 2.5 MG/0.5ML injection vial Inject 2.5 mg into the skin once a week.   triamcinolone  (KENALOG ) 0.025 % ointment Apply 1 Application topically 2 (two) times daily.   [DISCONTINUED] amphetamine -dextroamphetamine (ADDERALL XR) 5 MG 24 hr capsule Take 1 capsule (5 mg total) by mouth daily.   [DISCONTINUED] amphetamine -dextroamphetamine (ADDERALL XR) 5 MG 24 hr capsule  Take 1 capsule (5 mg total) by mouth daily.   [DISCONTINUED] amphetamine -dextroamphetamine (ADDERALL XR) 5 MG 24 hr capsule Take 1 capsule (5 mg total) by mouth daily.   No facility-administered encounter medications on file as of 11/06/2024.    Surgical History: Past Surgical History:  Procedure Laterality Date   MOLE REMOVAL Right    right side of chest    Medical History: Past Medical History:  Diagnosis Date   Allergy    Anxiety     Family History: Family History  Problem Relation Age of Onset   Hyperlipidemia Mother    Skin cancer Mother        'not melanoma'   Alcohol abuse Father    Heart disease Father    Hypertension Father    Arrhythmia Sister    Breast cancer Maternal Aunt    Cancer Maternal Aunt        Breast Cancer   Breast cancer Paternal Aunt    Cancer Paternal Aunt        Breast Cancer   Cancer Paternal Aunt        Breast Cancer   Breast cancer Maternal Grandmother    Cancer Maternal Grandmother        Breast Cancer   Breast cancer Maternal Grandfather    Cancer Maternal Grandfather        Prostate/Lung Cancer   Breast cancer Paternal Grandmother    Cancer Paternal Grandmother  Breast Cancer   Cancer Paternal Grandfather        Prostate Cancer   Colon cancer Neg Hx     Social History   Socioeconomic History   Marital status: Married    Spouse name: Not on file   Number of children: Not on file   Years of education: Not on file   Highest education level: Not on file  Occupational History   Not on file  Tobacco Use   Smoking status: Never   Smokeless tobacco: Never  Vaping Use   Vaping status: Never Used  Substance and Sexual Activity   Alcohol use: Yes    Comment: rare   Drug use: No   Sexual activity: Yes    Partners: Male    Birth control/protection: None    Comment: Husband  Other Topics Concern   Not on file  Social History Narrative   Married   Masters Degree   Caffeine- Soda diet Dr. Nunzio 3 cans daily, no  coffee    Children- 3    Pets: 1 dog     Social Drivers of Corporate Investment Banker Strain: Not on file  Food Insecurity: Not on file  Transportation Needs: Not on file  Physical Activity: Not on file  Stress: Not on file  Social Connections: Not on file  Intimate Partner Violence: Not on file      Review of Systems  Constitutional: Negative.  Negative for fatigue.  Respiratory: Negative.  Negative for cough, chest tightness, shortness of breath and wheezing.   Cardiovascular: Negative.  Negative for chest pain and palpitations.  Gastrointestinal: Negative.   Genitourinary: Negative.   Musculoskeletal: Negative.   Neurological: Negative.   Psychiatric/Behavioral:  Positive for decreased concentration and sleep disturbance. Negative for self-injury and suicidal ideas. The patient is nervous/anxious.     Vital Signs: BP 122/88   Pulse 80   Temp (!) 96.8 F (36 C)   Resp 16   Ht 5' 5 (1.651 m)   Wt 209 lb 6.4 oz (95 kg)   SpO2 99%   BMI 34.85 kg/m    Physical Exam Vitals reviewed.  Constitutional:      General: She is not in acute distress.    Appearance: Normal appearance. She is obese. She is not ill-appearing.  HENT:     Head: Normocephalic and atraumatic.  Eyes:     Pupils: Pupils are equal, round, and reactive to light.  Cardiovascular:     Rate and Rhythm: Normal rate and regular rhythm.  Pulmonary:     Effort: Pulmonary effort is normal. No respiratory distress.  Neurological:     Mental Status: She is alert and oriented to person, place, and time.  Psychiatric:        Attention and Perception: She is attentive.        Mood and Affect: Mood is not anxious. Affect is not labile.        Speech: Speech is not rapid and pressured.        Behavior: Behavior is not hyperactive.        Thought Content: Thought content is not paranoid. Thought content does not include homicidal or suicidal ideation.        Cognition and Memory: She does not exhibit  impaired recent memory.        Judgment: Judgment normal.        Assessment/Plan: 1. Mixed hyperlipidemia (Primary) Working on diet and exercise. Will be starting on zepbound  for weight  loss that may help as well. Will monitor, may need crestor  2. Attention deficit hyperactivity disorder (ADHD), predominantly inattentive type Doing well with 10mg  now and will send increased dose - amphetamine -dextroamphetamine (ADDERALL XR) 10 MG 24 hr capsule; Take 1 capsule (10 mg total) by mouth daily.  Dispense: 30 capsule; Refill: 0 [place] Controlled Substance Database was reviewed by me for overdose risk score (ORS) Refilled Controlled medications today. Reviewed risks and possible side effects associated with taking Stimulants. Combination of these drugs with other psychotropic medications could cause dizziness and drowsiness. Pt needs to Monitor symptoms and exercise caution in driving and operating heavy machinery to avoid damages to oneself, to others and to the surroundings. Patient verbalized understanding in this matter. Dependence and abuse for these drugs will be monitored closely. A Controlled substance policy and procedure is on file which allows [facility] to order a urine drug screen test at any visit. Patient understands and agrees with the plan..  3. Vitamin D  deficiency Will start Vit D supplement  4. Class 1 obesity due to excess calories without serious comorbidity with body mass index (BMI) of 34.0 to 34.9 in adult Will be starting on zepbound  to aid weight loss   General Counseling: Marissa verbalizes understanding of the findings of todays visit and agrees with plan of treatment. I have discussed any further diagnostic evaluation that may be needed or ordered today. We also reviewed her medications today. she has been encouraged to call the office with any questions or concerns that should arise related to todays visit.    No orders of the defined types were placed in  this encounter.   Meds ordered this encounter  Medications   amphetamine -dextroamphetamine (ADDERALL XR) 10 MG 24 hr capsule    Sig: Take 1 capsule (10 mg total) by mouth daily.    Dispense:  30 capsule    Refill:  0    This patient was seen by Tinnie Pro, PA-C in collaboration with Dr. Sigrid Bathe as a part of collaborative care agreement.   Total time spent:30 Minutes Time spent includes review of chart, medications, test results, and follow up plan with the patient.      Dr Fozia M Khan Internal medicine

## 2024-11-22 ENCOUNTER — Encounter: Payer: Self-pay | Admitting: Nurse Practitioner

## 2024-11-22 ENCOUNTER — Ambulatory Visit (INDEPENDENT_AMBULATORY_CARE_PROVIDER_SITE_OTHER): Admitting: Nurse Practitioner

## 2024-11-22 VITALS — BP 120/82 | HR 91 | Temp 96.9°F | Resp 16 | Ht 65.0 in | Wt 208.0 lb

## 2024-11-22 DIAGNOSIS — E66811 Obesity, class 1: Secondary | ICD-10-CM

## 2024-11-22 DIAGNOSIS — E782 Mixed hyperlipidemia: Secondary | ICD-10-CM | POA: Diagnosis not present

## 2024-11-22 DIAGNOSIS — Z124 Encounter for screening for malignant neoplasm of cervix: Secondary | ICD-10-CM

## 2024-11-22 DIAGNOSIS — F900 Attention-deficit hyperactivity disorder, predominantly inattentive type: Secondary | ICD-10-CM | POA: Diagnosis not present

## 2024-11-22 DIAGNOSIS — Z6834 Body mass index (BMI) 34.0-34.9, adult: Secondary | ICD-10-CM | POA: Diagnosis not present

## 2024-11-22 DIAGNOSIS — E6609 Other obesity due to excess calories: Secondary | ICD-10-CM | POA: Diagnosis not present

## 2024-11-22 MED ORDER — AMPHETAMINE-DEXTROAMPHET ER 5 MG PO CP24: 5.0000 mg | ORAL_CAPSULE | Freq: Every day | ORAL | 0 refills | Status: AC

## 2024-11-22 NOTE — Progress Notes (Signed)
 [facility] [address] [place], [place] [address]  Internal MEDICINE  Office Visit Note  Patient Name: Sophia Garrett  [id number]  [id number]  Date of Service: 11/22/2024  Chief Complaint  Patient presents with   Follow-up    HPI Sophia Garrett presents for a follow-up visit for pap smear, and ADHD.  Due for pap smear today Not doing well on the 10 mg dose. Wants to go back to the 5 mg dose of adderall.  High cholesterol -- no significant change from last year Low vitamin D  -- has improved to normal range.     Current Medication: Outpatient Encounter Medications as of 11/22/2024  Medication Sig   ALPRAZolam  (XANAX ) 0.25 MG tablet Take 1 tablet (0.25 mg total) by mouth as needed for up to 4 doses for anxiety (before a flight).   [START ON 12/20/2024] amphetamine -dextroamphetamine (ADDERALL XR) 5 MG 24 hr capsule Take 1 capsule (5 mg total) by mouth daily.   amphetamine -dextroamphetamine (ADDERALL XR) 5 MG 24 hr capsule Take 1 capsule (5 mg total) by mouth daily.   [START ON 01/17/2025] amphetamine -dextroamphetamine (ADDERALL XR) 5 MG 24 hr capsule Take 1 capsule (5 mg total) by mouth daily.   cetirizine (ZYRTEC) 10 MG tablet Take 10 mg by mouth daily.   escitalopram  (LEXAPRO ) 10 MG tablet Take 1 tablet (10 mg total) by mouth daily.   hydrocortisone  (ANUSOL -HC) 25 MG suppository Place 1 suppository (25 mg total) rectally 2 (two) times daily.   tirzepatide  (ZEPBOUND ) 2.5 MG/0.5ML injection vial Inject 2.5 mg into the skin once a week.   triamcinolone  (KENALOG ) 0.025 % ointment Apply 1 Application topically 2 (two) times daily.   [DISCONTINUED] amphetamine -dextroamphetamine (ADDERALL XR) 10 MG 24 hr capsule Take 1 capsule (10 mg total) by mouth daily.   No facility-administered encounter medications on file as of 11/22/2024.    Surgical History: Past Surgical History:  Procedure Laterality Date   MOLE REMOVAL Right    right side of chest    Medical History: Past  Medical History:  Diagnosis Date   Allergy    Anxiety     Family History: Family History  Problem Relation Age of Onset   Hyperlipidemia Mother    Skin cancer Mother        'not melanoma'   Alcohol abuse Father    Heart disease Father    Hypertension Father    Arrhythmia Sister    Breast cancer Maternal Aunt    Cancer Maternal Aunt        Breast Cancer   Breast cancer Paternal Aunt    Cancer Paternal Aunt        Breast Cancer   Cancer Paternal Aunt        Breast Cancer   Breast cancer Maternal Grandmother    Cancer Maternal Grandmother        Breast Cancer   Breast cancer Maternal Grandfather    Cancer Maternal Grandfather        Prostate/Lung Cancer   Breast cancer Paternal Grandmother    Cancer Paternal Grandmother        Breast Cancer   Cancer Paternal Grandfather        Prostate Cancer   Colon cancer Neg Hx     Social History   Socioeconomic History   Marital status: Married    Spouse name: Not on file   Number of children: Not on file   Years of education: Not on file   Highest education level: Not on file  Occupational History   Not on file  Tobacco Use   Smoking status: Never   Smokeless tobacco: Never  Vaping Use   Vaping status: Never Used  Substance and Sexual Activity   Alcohol use: Yes    Comment: rare   Drug use: No   Sexual activity: Yes    Partners: Male    Birth control/protection: None    Comment: Husband  Other Topics Concern   Not on file  Social History Narrative   Married   Masters Degree   Caffeine- Soda diet Dr. Nunzio 3 cans daily, no coffee    Children- 3    Pets: 1 dog     Social Drivers of Corporate Investment Banker Strain: Not on file  Food Insecurity: Not on file  Transportation Needs: Not on file  Physical Activity: Not on file  Stress: Not on file  Social Connections: Not on file  Intimate Partner Violence: Not on file      Review of Systems  Constitutional: Negative.  Negative for fatigue.   Respiratory: Negative.  Negative for cough, chest tightness, shortness of breath and wheezing.   Cardiovascular: Negative.  Negative for chest pain and palpitations.  Gastrointestinal: Negative.   Genitourinary: Negative.  Negative for decreased urine volume, menstrual problem, pelvic pain, vaginal bleeding, vaginal discharge and vaginal pain.  Musculoskeletal: Negative.   Neurological: Negative.   Psychiatric/Behavioral:  Positive for decreased concentration and sleep disturbance. Negative for self-injury and suicidal ideas. The patient is nervous/anxious.     Vital Signs: BP 120/82   Pulse 91   Temp (!) 95.9 F (35.5 C)   Resp 16   Ht 5' 5 (1.651 m)   Wt 208 lb (94.3 kg)   SpO2 98%   BMI 34.61 kg/m    Physical Exam Vitals reviewed.  Constitutional:      General: She is not in acute distress.    Appearance: Normal appearance. She is obese. She is not ill-appearing.  HENT:     Head: Normocephalic and atraumatic.  Eyes:     Pupils: Pupils are equal, round, and reactive to light.  Cardiovascular:     Rate and Rhythm: Normal rate and regular rhythm.  Pulmonary:     Effort: Pulmonary effort is normal. No respiratory distress.  Abdominal:     Hernia: There is no hernia in the left inguinal area or right inguinal area.  Genitourinary:    General: Normal vulva.     Exam position: Lithotomy position.     Pubic Area: No rash.      Labia:        Right: No rash, tenderness, lesion or injury.        Left: No rash, tenderness, lesion or injury.      Urethra: No prolapse, urethral pain, urethral swelling or urethral lesion.     Vagina: Normal. No signs of injury and foreign body. No vaginal discharge, erythema, tenderness, bleeding, lesions or prolapsed vaginal walls.     Cervix: Normal. No cervical motion tenderness, discharge, friability, lesion, erythema, cervical bleeding or eversion.     Uterus: Normal.      Adnexa: Right adnexa normal and left adnexa normal.     Rectum: No  mass, tenderness, anal fissure or external hemorrhoid.  Lymphadenopathy:     Lower Body: No right inguinal adenopathy. No left inguinal adenopathy.  Neurological:     Mental Status: She is alert and oriented to person, place, and time.  Psychiatric:  Mood and Affect: Mood normal.        Behavior: Behavior normal.        Assessment/Plan: 1. Mixed hyperlipidemia (Primary) Continue to work on weight loss, on zepbound .   2. Class 1 obesity due to excess calories without serious comorbidity with body mass index (BMI) of 34.0 to 34.9 in adult Patient has restarted the zepbound  via mail order [facility].   3. Routine Papanicolaou smear Routine pelvic exam performed and pap smear specimen obtained and sent to lab. Normal pelvic exam. No additional concerns. - IGP, Aptima HPV  4. Attention deficit hyperactivity disorder (ADHD), predominantly inattentive type Adderall dose decreased back to 5 mg. Continue as prescribed. Follow up in 3 months for additional refills.  - amphetamine -dextroamphetamine (ADDERALL XR) 5 MG 24 hr capsule; Take 1 capsule (5 mg total) by mouth daily.  Dispense: 30 capsule; Refill: 0 - amphetamine -dextroamphetamine (ADDERALL XR) 5 MG 24 hr capsule; Take 1 capsule (5 mg total) by mouth daily.  Dispense: 30 capsule; Refill: 0 - amphetamine -dextroamphetamine (ADDERALL XR) 5 MG 24 hr capsule; Take 1 capsule (5 mg total) by mouth daily.  Dispense: 30 capsule; Refill: 0   General Counseling: Lawana verbalizes understanding of the findings of todays visit and agrees with plan of treatment. I have discussed any further diagnostic evaluation that may be needed or ordered today. We also reviewed her medications today. she has been encouraged to call the office with any questions or concerns that should arise related to todays visit.    No orders of the defined types were placed in this encounter.   Meds ordered this encounter  Medications    amphetamine -dextroamphetamine (ADDERALL XR) 5 MG 24 hr capsule    Sig: Take 1 capsule (5 mg total) by mouth daily.    Dispense:  30 capsule    Refill:  0    Fill for January   amphetamine -dextroamphetamine (ADDERALL XR) 5 MG 24 hr capsule    Sig: Take 1 capsule (5 mg total) by mouth daily.    Dispense:  30 capsule    Refill:  0    Note decreased dose. Fill new script now.   amphetamine -dextroamphetamine (ADDERALL XR) 5 MG 24 hr capsule    Sig: Take 1 capsule (5 mg total) by mouth daily.    Dispense:  30 capsule    Refill:  0    Fill for february    Return in about 3 months (around 02/13/2025) for F/U, ADHD med check, Elmar Antigua PCP.   Total time spent:30 Minutes Time spent includes review of chart, medications, test results, and follow up plan with the patient.   [place] Controlled Substance Database was reviewed by me.  This patient was seen by Mardy Maxin, FNP-C in collaboration with Dr. Sigrid Bathe as a part of collaborative care agreement.   Tinleigh Whitmire R. Maxin, MSN, FNP-C Internal medicine

## 2024-11-27 LAB — IGP, APTIMA HPV: HPV Aptima: NEGATIVE

## 2024-12-11 ENCOUNTER — Ambulatory Visit: Payer: Self-pay | Admitting: Nurse Practitioner

## 2024-12-11 NOTE — Progress Notes (Signed)
 Pap smear was normal and negative for HPV. Repeat pap smear with HPV in 5 years

## 2024-12-11 NOTE — Telephone Encounter (Signed)
Left patient a message to give office a callback.Marland Kitchen

## 2024-12-12 NOTE — Telephone Encounter (Signed)
-----   Message from Mardy Maxin, NP sent at 12/11/2024  8:24 AM EST ----- Pap smear was normal and negative for HPV. Repeat pap smear with HPV in 5 years

## 2024-12-12 NOTE — Telephone Encounter (Signed)
"  Patient notified   "

## 2024-12-31 ENCOUNTER — Other Ambulatory Visit: Payer: Self-pay | Admitting: Nurse Practitioner

## 2024-12-31 DIAGNOSIS — F411 Generalized anxiety disorder: Secondary | ICD-10-CM

## 2025-02-13 ENCOUNTER — Ambulatory Visit: Admitting: Nurse Practitioner

## 2025-10-10 ENCOUNTER — Encounter: Admitting: Nurse Practitioner
